# Patient Record
Sex: Female | Born: 1940 | Race: White | Hispanic: No | Marital: Married | Smoking: Never smoker
Health system: Southern US, Community
[De-identification: ages and names within clinical notes are randomized; demographics above are authoritative.]

## PROBLEM LIST (undated history)

## (undated) DIAGNOSIS — I10 Essential (primary) hypertension: Secondary | ICD-10-CM

## (undated) DIAGNOSIS — I739 Peripheral vascular disease, unspecified: Secondary | ICD-10-CM

## (undated) DIAGNOSIS — I2510 Atherosclerotic heart disease of native coronary artery without angina pectoris: Secondary | ICD-10-CM

## (undated) HISTORY — PX: TONSILLECTOMY: SUR1361

## (undated) HISTORY — DX: Peripheral vascular disease, unspecified: I73.9

## (undated) HISTORY — DX: Atherosclerotic heart disease of native coronary artery without angina pectoris: I25.10

---

## 2014-04-16 DIAGNOSIS — I779 Disorder of arteries and arterioles, unspecified: Secondary | ICD-10-CM

## 2014-04-16 DIAGNOSIS — I739 Peripheral vascular disease, unspecified: Secondary | ICD-10-CM | POA: Insufficient documentation

## 2014-04-16 HISTORY — DX: Disorder of arteries and arterioles, unspecified: I77.9

## 2014-04-16 HISTORY — DX: Peripheral vascular disease, unspecified: I73.9

## 2015-03-18 DIAGNOSIS — I10 Essential (primary) hypertension: Secondary | ICD-10-CM

## 2015-03-18 HISTORY — DX: Essential (primary) hypertension: I10

## 2015-04-14 ENCOUNTER — Inpatient Hospital Stay (HOSPITAL_COMMUNITY)
Admission: EM | Admit: 2015-04-14 | Discharge: 2015-04-15 | DRG: 282 | Disposition: A | Discharge status: Home / Self Care | Payer: Medicare Other | Attending: Internal Medicine | Admitting: Internal Medicine

## 2015-04-14 ENCOUNTER — Observation Stay (HOSPITAL_COMMUNITY): Payer: Medicare Other

## 2015-04-14 ENCOUNTER — Other Ambulatory Visit (HOSPITAL_COMMUNITY): Payer: Self-pay

## 2015-04-14 ENCOUNTER — Encounter (HOSPITAL_COMMUNITY): Payer: Self-pay | Admitting: *Deleted

## 2015-04-14 DIAGNOSIS — E876 Hypokalemia: Secondary | ICD-10-CM | POA: Diagnosis present

## 2015-04-14 DIAGNOSIS — Z79899 Other long term (current) drug therapy: Secondary | ICD-10-CM

## 2015-04-14 DIAGNOSIS — I214 Non-ST elevation (NSTEMI) myocardial infarction: Secondary | ICD-10-CM

## 2015-04-14 DIAGNOSIS — R51 Headache: Secondary | ICD-10-CM

## 2015-04-14 DIAGNOSIS — Z7982 Long term (current) use of aspirin: Secondary | ICD-10-CM

## 2015-04-14 DIAGNOSIS — R0989 Other specified symptoms and signs involving the circulatory and respiratory systems: Secondary | ICD-10-CM

## 2015-04-14 DIAGNOSIS — I169 Hypertensive crisis, unspecified: Secondary | ICD-10-CM

## 2015-04-14 DIAGNOSIS — I10 Essential (primary) hypertension: Secondary | ICD-10-CM | POA: Diagnosis not present

## 2015-04-14 DIAGNOSIS — R519 Headache, unspecified: Secondary | ICD-10-CM

## 2015-04-14 DIAGNOSIS — I160 Hypertensive urgency: Secondary | ICD-10-CM

## 2015-04-14 DIAGNOSIS — R55 Syncope and collapse: Secondary | ICD-10-CM | POA: Diagnosis not present

## 2015-04-14 DIAGNOSIS — Z832 Family history of diseases of the blood and blood-forming organs and certain disorders involving the immune mechanism: Secondary | ICD-10-CM

## 2015-04-14 HISTORY — DX: Essential (primary) hypertension: I10

## 2015-04-14 HISTORY — DX: Non-ST elevation (NSTEMI) myocardial infarction: I21.4

## 2015-04-14 HISTORY — DX: Other specified symptoms and signs involving the circulatory and respiratory systems: R09.89

## 2015-04-14 HISTORY — DX: Hypertensive urgency: I16.0

## 2015-04-14 LAB — CBC WITH DIFFERENTIAL/PLATELET
BASOS PCT: 0 % (ref 0–1)
Basophils Absolute: 0 10*3/uL (ref 0.0–0.1)
EOS PCT: 0 % (ref 0–5)
Eosinophils Absolute: 0 10*3/uL (ref 0.0–0.7)
HEMATOCRIT: 37.4 % (ref 36.0–46.0)
HEMOGLOBIN: 12.6 g/dL (ref 12.0–15.0)
Lymphocytes Relative: 17 % (ref 12–46)
Lymphs Abs: 1.4 10*3/uL (ref 0.7–4.0)
MCH: 28.3 pg (ref 26.0–34.0)
MCHC: 33.7 g/dL (ref 30.0–36.0)
MCV: 83.9 fL (ref 78.0–100.0)
MONO ABS: 0.9 10*3/uL (ref 0.1–1.0)
MONOS PCT: 10 % (ref 3–12)
NEUTROS ABS: 6.1 10*3/uL (ref 1.7–7.7)
Neutrophils Relative %: 73 % (ref 43–77)
Platelets: 298 10*3/uL (ref 150–400)
RBC: 4.46 MIL/uL (ref 3.87–5.11)
RDW: 13.2 % (ref 11.5–15.5)
WBC: 8.4 10*3/uL (ref 4.0–10.5)

## 2015-04-14 LAB — I-STAT CHEM 8, ED
BUN: 14 mg/dL (ref 6–23)
CHLORIDE: 96 mmol/L (ref 96–112)
CREATININE: 1 mg/dL (ref 0.50–1.10)
Calcium, Ion: 1.14 mmol/L (ref 1.13–1.30)
GLUCOSE: 118 mg/dL — AB (ref 70–99)
HCT: 39 % (ref 36.0–46.0)
Hemoglobin: 13.3 g/dL (ref 12.0–15.0)
Potassium: 4.1 mmol/L (ref 3.5–5.1)
Sodium: 137 mmol/L (ref 135–145)
TCO2: 27 mmol/L (ref 0–100)

## 2015-04-14 LAB — I-STAT TROPONIN, ED: Troponin i, poc: 0.5 ng/mL (ref 0.00–0.08)

## 2015-04-14 MED ORDER — METOPROLOL TARTRATE 1 MG/ML IV SOLN
5.0000 mg | Freq: Once | INTRAVENOUS | Status: AC
  Administered 2015-04-14: 5 mg via INTRAVENOUS
  Filled 2015-04-14: qty 5

## 2015-04-14 MED ORDER — NITROGLYCERIN 2 % TD OINT
0.5000 [in_us] | TOPICAL_OINTMENT | Freq: Once | TRANSDERMAL | Status: AC
  Administered 2015-04-14: 0.5 [in_us] via TOPICAL
  Filled 2015-04-14: qty 1

## 2015-04-14 MED ORDER — ASPIRIN 81 MG PO CHEW
324.0000 mg | CHEWABLE_TABLET | Freq: Once | ORAL | Status: AC
  Administered 2015-04-14: 324 mg via ORAL
  Filled 2015-04-14: qty 4

## 2015-04-14 MED ORDER — HEPARIN (PORCINE) IN NACL 100-0.45 UNIT/ML-% IJ SOLN
800.0000 [IU]/h | INTRAMUSCULAR | Status: DC
  Administered 2015-04-15: 800 [IU]/h via INTRAVENOUS
  Filled 2015-04-14: qty 250

## 2015-04-14 MED ORDER — HEPARIN BOLUS VIA INFUSION
4000.0000 [IU] | Freq: Once | INTRAVENOUS | Status: AC
  Administered 2015-04-15: 4000 [IU] via INTRAVENOUS
  Filled 2015-04-14: qty 4000

## 2015-04-14 NOTE — ED Notes (Signed)
Pt c/o frontal headache x 3 days; reports intermittent visual issues. Pt reports having a physical two weeks ago and was told blood pressure was elevated. Since PCP visit, pt has changed diet to include more vegetables and fruits with very little meat. No neuro deficits at present.

## 2015-04-14 NOTE — H&P (Addendum)
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  She states that recently when getting a physical for this new job she was told she was markedly hypertensive.  She wanted to attempt diet instead of medications.  Per husband, this means she has not eaten much the last couple of days.  Today she was walking up steps when she had the episode above.  She said this only lasted about 5 minutes.  EM[facility] was called and [facility].  She reports a headache for the last few days associated with some vision changes.  Evaluation here revealed SR with RBBB on ECG and wandering of baseline and normal labs except for troponin of 0.5.     Review of Systems:     Cardiac Review of Systems: {Y] = yes [ ]  = no  Chest Pain [    ]  Resting SOB [   ] Exertional SOB  [  ]  Orthopnea [  ]   Pedal Edema [   ]    Palpitations [  ] Syncope  [  ]   Presyncope [x   ]  General Review of Systems: [Y] = yes [  ]=no Constitional: recent weight change [  ]; anorexia [ x ]; fatigue [  ]; nausea [  ]; night sweats [  ]; fever [  ]; or chills [  ];                                                                      Dental: poor dentition[  ];   Eye : blurred vision [  ]; diplopia [   ]; vision changes [x  ];  Amaurosis fugax[  ]; Resp: cough [  ];  wheezing[  ];  hemoptysis[  ]; shortness of breath[  ]; paroxysmal nocturnal dyspnea[  ]; dyspnea on exertion[  ]; or orthopnea[  ];  GI:  gallstones[  ], vomiting[  ];  dysphagia[  ]; melena[  ];  hematochezia [  ]; heartburn[  ];   GU: kidney stones [  ]; hematuria[  ];   dysuria [  ];  nocturia[  ];               Skin: rash [  ], swelling[  ];, hair loss[  ];  peripheral edema[  ];  or itching[  ]; Musculosketetal: myalgias[  ];  joint swelling[  ];   joint erythema[  ];  joint pain[  ];  back pain[  ];  Heme/Lymph: bruising[  ];  bleeding[  ];  anemia[  ];  Neuro: TIA[  ];  headaches[  ];  stroke[  ];  vertigo[  ];  seizures[  ];   paresthesias[  ];  difficulty walking[  ];  Psych:depression[  ]; anxiety[  ];  Endocrine: diabetes[  ];  thyroid dysfunction[  ];  Other:  Past Medical History  Diagnosis Date  . Hypertension 03/2015    No current facility-administered medications on  file prior to encounter.   No current outpatient prescriptions on file prior to encounter.     Allergies  Allergen Reactions  . Codeine     Makes me crazy    History   Social History  . Marital Status: Married    Spouse Name: N/A  . Number of Children: N/A  . Years of Education: N/A   Occupational History  . Not on file.   Social History Main Topics  . Smoking status: Never Smoker   . Smokeless tobacco: Not on file  . Alcohol Use: No  . Drug Use: No  . Sexual Activity: Not on file   Other Topics Concern  . Not on file   Social History Narrative  . No narrative on file    Family History  Problem Relation Age of Onset  . Protein S deficiency      "family"  . Deep vein thrombosis      "family"    PHYSICAL EXAM: Filed Vitals:   04/14/15 2330  BP: 194/69  Pulse: 76  Temp:   Resp: 12   General:  Well appearing. No respiratory difficulty HEENT: normal Neck: supple. no JVD; bilateral carotid bruits, R>L. No lymphadenopathy or thryomegaly appreciated. Cor: PMI nondisplaced. Regular rate & rhythm. No rubs, gallops or murmurs. Lungs: clear Abdomen: soft, nontender, nondistended. No hepatosplenomegaly. No bruits or masses. Good bowel sounds. Extremities: no cyanosis, clubbing, rash, edema Neuro: alert & oriented x 3, cranial nerves grossly intact. moves all 4 extremities w/o difficulty. Affect pleasant.  ECG: SR with RBBB.  There is baseline wander noted inferior leads.  Results for orders placed or performed during the  hospital encounter of 04/14/15 (from the past 24 hour(s))  CBC with Differential/Platelet     Status: None   Collection Time: 04/14/15  9:55 PM  Result Value Ref Range   WBC 8.4 4.0 - 10.5 K/uL   RBC 4.46 3.87 - 5.11 MIL/uL   Hemoglobin 12.6 12.0 - 15.0 g/dL   HCT 95.6 21.3 - 08.6 %   MCV 83.9 78.0 - 100.0 fL   MCH 28.3 26.0 - 34.0 pg   MCHC 33.7 30.0 - 36.0 g/dL   RDW 57.8 46.9 - 62.9 %   Platelets 298 150 - 400 K/uL   Neutrophils Relative % 73 43 - 77 %   Neutro Abs 6.1 1.7 - 7.7 K/uL   Lymphocytes Relative 17 12 - 46 %   Lymphs Abs 1.4 0.7 - 4.0 K/uL   Monocytes Relative 10 3 - 12 %   Monocytes Absolute 0.9 0.1 - 1.0 K/uL   Eosinophils Relative 0 0 - 5 %   Eosinophils Absolute 0.0 0.0 - 0.7 K/uL   Basophils Relative 0 0 - 1 %   Basophils Absolute 0.0 0.0 - 0.1 K/uL  I-stat chem 8, ed     Status: Abnormal   Collection Time: 04/14/15  9:59 PM  Result Value Ref Range   Sodium 137 135 - 145 mmol/L   Potassium 4.1 3.5 - 5.1 mmol/L   Chloride 96 96 - 112 mmol/L   BUN 14 6 - 23 mg/dL   Creatinine, Ser 5.28 0.50 - 1.10 mg/dL   Glucose, Bld 413 (H) 70 - 99 mg/dL   Calcium, Ion 2.44 0.10 - 1.30 mmol/L   TCO2 27 0 - 100 mmol/L   Hemoglobin 13.3 12.0 - 15.0 g/dL   HCT 27.2 53.6 - 64.4 %  I-stat troponin, ED     Status: Abnormal  Collection Time: 04/14/15 10:00 PM  Result Value Ref Range   Troponin i, poc 0.50 (HH) 0.00 - 0.08 ng/mL   Comment NOTIFIED PHYSICIAN    Comment 3           No results found.   ASSESSMENT: 74 yo woman with hypertension presenting with sudden onset of fatigue, "hot feeling" and "just feeling odd" that lasted about 5 minutes and noted to be markedly hypertensive in ED along with elevated troponin of 0.5.  Given her recent diagnosis of accelerated HTN, headache and visual changes makes hypertensive emergency possible although she is currently asymptomatic.  NSTEMI is also a concern with above presentation.    PLAN/DISCUSSION: ASA, heparin drip NTG  paste on, this should help with BP and can transition to NTG drip if needed Start carvedilol 6.25 mg bid and amlodipine 5 mg tonight Consider starting diuretic such as HCTZ in am Cycle troponin to peak Will not start statin as patient refuses to do so tonight and actually prefers not to take any medication Risk stratify with A1c and lipids Check TSH Echocardiogram in AM I suspect she will likely need a heart catheterization, though not sure she will be readily agreeable and will also need to ensure she will be compliant with medication if that route is chosen.  Carotid dopplers to assess bilateral bruits (R>L)  Emin Foree 04/14/2015 11:51 PM

## 2015-04-14 NOTE — ED Notes (Signed)
Cardiology at bedside.

## 2015-04-14 NOTE — ED Notes (Signed)
[place] after having a near syncopal episode. Reports sudden onset of dizziness around 8pm. Bystanders lowered pt to ground by bystanders. EMS reports orthostatic VS: lying 180/80, sitting 170/82, standing 104 palpated. Pt also c/o intermittent frontal headache x 3 days with visual disturbances described as black spots. Pt recently started new diet, consisting primarily of fruits and vegetables and no meats. Pt noted to be hypertensive on arrival

## 2015-04-14 NOTE — ED Provider Notes (Signed)
CSN: [id number]     Arrival date & time 04/14/15  2111 History   First MD Initiated Contact with Patient 04/14/15 2118     Chief Complaint  Patient presents with  . Near Syncope     (Consider location/radiation/quality/duration/timing/severity/associated sxs/prior Treatment) HPI Comments: The patient is a 74 year old female who presents to the hospital with a frontal headache which has been present for approximately 3 days, this is intermittent, and associated with severe hypertension this evening. This headache has not been associated with fevers chills nausea vomiting diaphoresis blurred vision diarrhea dysuria swelling chest pain palpitations or shortness of breath. She states that this evening while she was training for a new job she was walking lots of stairs and became very lightheaded and had a near syncope event. She denies loss of consciousness and denies any symptoms other than that except for mild nausea. She states that she did get a little bit sweaty. She denies having any prior medical history other than recently being diagnosed with hypertension for which she has changed her diet and has been eating small amounts of food including salads and fruit. Today she had a very small amount of food last ate at 4:00 when she had a piece of toast, piece of fruit.  Patient is a 74 y.o. female presenting with near-syncope. The history is provided by the patient.  Near Syncope    History reviewed. No pertinent past medical history. Past Surgical History  Procedure Laterality Date  . Tonsillectomy     History reviewed. No pertinent family history. History  Substance Use Topics  . Smoking status: Never Smoker   . Smokeless tobacco: Not on file  . Alcohol Use: No   OB History    No data available     Review of Systems  Cardiovascular: Positive for near-syncope.  All other systems reviewed and are negative.     Allergies  Codeine  Home Medications   Prior to Admission  medications   Medication Sig Start Date End Date Taking? Authorizing Provider  Ascorbic Acid (VITAMIN C) 1000 MG tablet Take 1,000 mg by mouth daily.   Yes Historical Provider, MD  Cholecalciferol (VITAMIN D PO) Take 1 tablet by mouth daily.   Yes Historical Provider, MD  MELATONIN PO Take 1 tablet by mouth daily as needed. For sleep per patient   Yes Historical Provider, MD  Multiple Vitamin (MULTIVITAMIN WITH MINERALS) TABS tablet Take 1 tablet by mouth daily.   Yes Historical Provider, MD  Potassium 99 MG TABS Take 1 tablet by mouth daily.   Yes Historical Provider, MD   BP 186/72 mmHg  Pulse 78  Temp(Src) 98.7 F (37.1 C) (Oral)  Resp 12  Ht 5\' 4"  (1.626 m)  Wt 130 lb (58.968 kg)  BMI 22.30 kg/m2  SpO2 96% Physical Exam  Constitutional: She appears well-developed and well-nourished. No distress.  HENT:  Head: Normocephalic and atraumatic.  Mouth/Throat: Oropharynx is clear and moist. No oropharyngeal exudate.  Eyes: Conjunctivae and EOM are normal. Pupils are equal, round, and reactive to light. Right eye exhibits no discharge. Left eye exhibits no discharge. No scleral icterus.  Neck: Normal range of motion. Neck supple. No JVD present. No thyromegaly present.  Cardiovascular: Normal rate, regular rhythm, normal heart sounds and intact distal pulses.  Exam reveals no gallop and no friction rub.   No murmur heard. Pulmonary/Chest: Effort normal and breath sounds normal. No respiratory distress. She has no wheezes. She has no rales.  Abdominal: Soft. Bowel sounds  are normal. She exhibits no distension and no mass. There is no tenderness.  Musculoskeletal: Normal range of motion. She exhibits no edema or tenderness.  Lymphadenopathy:    She has no cervical adenopathy.  Neurological: She is alert. Coordination normal.  Skin: Skin is warm and dry. No rash noted. No erythema.  Psychiatric: She has a normal mood and affect. Her behavior is normal.  Nursing note and vitals  reviewed.   ED Course  Procedures (including critical care time) Labs Review Labs Reviewed  I-STAT CHEM 8, ED - Abnormal; Notable for the following:    Glucose, Bld 118 (*)    All other components within normal limits  I-STAT TROPOININ, ED - Abnormal; Notable for the following:    Troponin i, poc 0.50 (*)    All other components within normal limits  CBC WITH DIFFERENTIAL/PLATELET    Imaging Review No results found.  ED ECG REPORT  I personally interpreted this EKG   Date: 04/14/2015   Rate: 78  Rhythm: normal sinus rhythm  QRS Axis: normal  Intervals: normal  ST/T Wave abnormalities: ST elevations inferiorly  Conduction Disutrbances:right bundle branch block  Narrative Interpretation:   Old EKG Reviewed: none available  ED ECG REPORT  I personally interpreted this EKG   Date: 04/14/2015   Rate: 75  Rhythm: normal sinus rhythm  QRS Axis: normal  Intervals: normal  ST/T Wave abnormalities: nonspecific T wave changes  Conduction Disutrbances:none  Narrative Interpretation:   Old EKG Reviewed: changes noted   MDM   Final diagnoses:  Headache  NSTEMI (non-ST elevated myocardial infarction)  Hypertensive crisis    The initial EKG was very concerning for ischemia with elevation in the inferior leads though the baseline was not true, repeat EKG did not show the same ischemic findings, the patient had no chest pain and no ongoing symptoms on arrival. Because of the abnormal EKG a cardiac workup was initiated and a troponin came back elevated at 0.5. Her blood pressure maintained a significant elevation, she was given a dose of Lopressor as well as started on nitroglycerin and heparin drip, care was discussed with the on-call cardiologist who agrees to admit the patient to a stepdown unit for a non-ST elevation MI. The patient is critically ill and will need admission to the hospital, heparin drip, nitroglycerin.  CRITICAL CARE Performed by: Vida Roller Total critical  care time: 35 Critical care time was exclusive of separately billable procedures and treating other patients. Critical care was necessary to treat or prevent imminent or life-threatening deterioration. Critical care was time spent personally by me on the following activities: development of treatment plan with patient and/or surrogate as well as nursing, discussions with consultants, evaluation of patient's response to treatment, examination of patient, obtaining history from patient or surrogate, ordering and performing treatments and interventions, ordering and review of laboratory studies, ordering and review of radiographic studies, pulse oximetry and re-evaluation of patient's condition.   Meds given in ED:  Medications  heparin bolus via infusion 4,000 Units (not administered)  heparin ADULT infusion 100 units/mL (25000 units/250 mL) (not administered)  nitroGLYCERIN (NITROGLYN) 2 % ointment 0.5 inch (not administered)  metoprolol (LOPRESSOR) injection 5 mg (5 mg Intravenous Given 04/14/15 2244)  aspirin chewable tablet 324 mg (324 mg Oral Given 04/14/15 2243)         Eber Hong, MD 04/14/15 2245

## 2015-04-15 DIAGNOSIS — R0989 Other specified symptoms and signs involving the circulatory and respiratory systems: Secondary | ICD-10-CM

## 2015-04-15 DIAGNOSIS — I214 Non-ST elevation (NSTEMI) myocardial infarction: Secondary | ICD-10-CM | POA: Diagnosis present

## 2015-04-15 DIAGNOSIS — Z832 Family history of diseases of the blood and blood-forming organs and certain disorders involving the immune mechanism: Secondary | ICD-10-CM | POA: Diagnosis not present

## 2015-04-15 DIAGNOSIS — I10 Essential (primary) hypertension: Secondary | ICD-10-CM

## 2015-04-15 DIAGNOSIS — Z7982 Long term (current) use of aspirin: Secondary | ICD-10-CM | POA: Diagnosis not present

## 2015-04-15 DIAGNOSIS — E785 Hyperlipidemia, unspecified: Secondary | ICD-10-CM | POA: Diagnosis not present

## 2015-04-15 DIAGNOSIS — Z79899 Other long term (current) drug therapy: Secondary | ICD-10-CM | POA: Diagnosis not present

## 2015-04-15 DIAGNOSIS — R55 Syncope and collapse: Secondary | ICD-10-CM | POA: Diagnosis present

## 2015-04-15 DIAGNOSIS — I249 Acute ischemic heart disease, unspecified: Secondary | ICD-10-CM

## 2015-04-15 DIAGNOSIS — I213 ST elevation (STEMI) myocardial infarction of unspecified site: Secondary | ICD-10-CM | POA: Diagnosis not present

## 2015-04-15 DIAGNOSIS — Z951 Presence of aortocoronary bypass graft: Secondary | ICD-10-CM | POA: Diagnosis not present

## 2015-04-15 DIAGNOSIS — E876 Hypokalemia: Secondary | ICD-10-CM | POA: Diagnosis present

## 2015-04-15 LAB — TROPONIN I
TROPONIN I: 0.82 ng/mL — AB (ref <0.031)
TROPONIN I: 0.77 ng/mL — AB (ref <0.031)

## 2015-04-15 LAB — BASIC METABOLIC PANEL
Anion gap: 12 (ref 5–15)
BUN: 9 mg/dL (ref 6–23)
CO2: 27 mmol/L (ref 19–32)
Calcium: 9 mg/dL (ref 8.4–10.5)
Chloride: 98 mmol/L (ref 96–112)
Creatinine, Ser: 0.83 mg/dL (ref 0.50–1.10)
GFR calc Af Amer: 80 mL/min — ABNORMAL LOW (ref >90)
GFR calc non Af Amer: 69 mL/min — ABNORMAL LOW (ref >90)
Glucose, Bld: 86 mg/dL (ref 70–99)
POTASSIUM: 3.1 mmol/L — AB (ref 3.5–5.1)
SODIUM: 137 mmol/L (ref 135–145)

## 2015-04-15 LAB — CBC
HCT: 34.2 % — ABNORMAL LOW (ref 36.0–46.0)
HEMATOCRIT: 34.4 % — AB (ref 36.0–46.0)
HEMOGLOBIN: 11.3 g/dL — AB (ref 12.0–15.0)
HEMOGLOBIN: 11.3 g/dL — AB (ref 12.0–15.0)
MCH: 27.5 pg (ref 26.0–34.0)
MCH: 27.4 pg (ref 26.0–34.0)
MCHC: 33 g/dL (ref 30.0–36.0)
MCHC: 32.8 g/dL (ref 30.0–36.0)
MCV: 83.2 fL (ref 78.0–100.0)
MCV: 83.5 fL (ref 78.0–100.0)
Platelets: 298 10*3/uL (ref 150–400)
Platelets: 285 10*3/uL (ref 150–400)
RBC: 4.11 MIL/uL (ref 3.87–5.11)
RBC: 4.12 MIL/uL (ref 3.87–5.11)
RDW: 13.1 % (ref 11.5–15.5)
RDW: 13.2 % (ref 11.5–15.5)
WBC: 7 10*3/uL (ref 4.0–10.5)
WBC: 6.7 10*3/uL (ref 4.0–10.5)

## 2015-04-15 LAB — LIPID PANEL
Cholesterol: 230 mg/dL — ABNORMAL HIGH (ref 0–200)
HDL: 41 mg/dL (ref >39)
LDL CALC: 172 mg/dL — AB (ref 0–99)
Total CHOL/HDL Ratio: 5.6 RATIO
Triglycerides: 87 mg/dL (ref <150)
VLDL: 17 mg/dL (ref 0–40)

## 2015-04-15 LAB — BRAIN NATRIURETIC PEPTIDE: B Natriuretic Peptide: 181.5 pg/mL — ABNORMAL HIGH (ref 0.0–100.0)

## 2015-04-15 LAB — MRSA PCR SCREENING: MRSA by PCR: NEGATIVE

## 2015-04-15 LAB — HEPARIN LEVEL (UNFRACTIONATED): Heparin Unfractionated: 0.47 IU/mL (ref 0.30–0.70)

## 2015-04-15 LAB — TSH: TSH: 2.257 u[IU]/mL (ref 0.350–4.500)

## 2015-04-15 MED ORDER — ACETAMINOPHEN 325 MG PO TABS: 650.0000 mg | ORAL_TABLET | ORAL | Status: DC | PRN

## 2015-04-15 MED ORDER — CARVEDILOL 6.25 MG PO TABS
6.2500 mg | ORAL_TABLET | Freq: Two times a day (BID) | ORAL | Status: DC
  Administered 2015-04-15: 6.25 mg via ORAL
  Filled 2015-04-15: qty 1

## 2015-04-15 MED ORDER — NITROGLYCERIN 0.4 MG SL SUBL: 0.4000 mg | SUBLINGUAL_TABLET | SUBLINGUAL | Status: DC | PRN

## 2015-04-15 MED ORDER — ONDANSETRON HCL 4 MG/2ML IJ SOLN: 4.0000 mg | Freq: Four times a day (QID) | INTRAMUSCULAR | Status: DC | PRN

## 2015-04-15 MED ORDER — AMLODIPINE BESYLATE 5 MG PO TABS: 5.0000 mg | ORAL_TABLET | Freq: Every day | ORAL | Status: DC

## 2015-04-15 MED ORDER — ASPIRIN EC 81 MG PO TBEC
81.0000 mg | DELAYED_RELEASE_TABLET | Freq: Every day | ORAL | Status: DC
  Administered 2015-04-15: 81 mg via ORAL
  Filled 2015-04-15 (×2): qty 1

## 2015-04-15 MED ORDER — LISINOPRIL 10 MG PO TABS: 10.0000 mg | ORAL_TABLET | Freq: Every day | ORAL | Status: DC

## 2015-04-15 MED ORDER — AMLODIPINE BESYLATE 5 MG PO TABS
5.0000 mg | ORAL_TABLET | Freq: Every day | ORAL | Status: DC
  Administered 2015-04-15: 5 mg via ORAL
  Filled 2015-04-15: qty 1

## 2015-04-15 MED ORDER — ASPIRIN 81 MG PO TBEC: 81.0000 mg | DELAYED_RELEASE_TABLET | Freq: Every day | ORAL | Status: DC

## 2015-04-15 MED ORDER — CARVEDILOL 6.25 MG PO TABS: 6.2500 mg | ORAL_TABLET | Freq: Two times a day (BID) | ORAL | Status: AC

## 2015-04-15 NOTE — ED Notes (Signed)
Charge Nurse informed appt time@0350 .

## 2015-04-15 NOTE — Progress Notes (Signed)
ANTICOAGULATION CONSULT NOTE - Initial Consult  Pharmacy Consult for heparin Indication: chest pain/ACS  Allergies  Allergen Reactions  . Codeine     Makes me crazy    Patient Measurements: Height: 5\' 4"  (162.6 cm) Weight: 130 lb (58.968 kg) IBW/kg (Calculated) : 54.7  Vital Signs: Temp: 98.7 F (37.1 C) (04/28 2240) Temp Source: Oral (04/28 2122) BP: 148/54 mmHg (04/29 0230) Pulse Rate: 70 (04/29 0230)  Labs:  Recent Labs  04/14/15 2155 04/14/15 2159  HGB 12.6 13.3  HCT 37.4 39.0  PLT 298  --   CREATININE  --  1.00    Estimated Creatinine Clearance: 43.9 mL/min (by C-G formula based on Cr of 1).   Medical History: Past Medical History  Diagnosis Date  . Hypertension 03/2015     Assessment: 73yo female c/o sudden onset of fatigue and feeling "hot and odd", initial istat troponin elevated, to begin heparin; of note pt initially refused heparin and would prefer "natural" medications, heparin has since been started.  Goal of Therapy:  Heparin level 0.3-0.7 units/ml Monitor platelets by anticoagulation protocol: Yes   Plan:  EDP started heparin with 4000 units IV bolus followed by gtt at 800 units/hr; will continue for now and monitor heparin levels and CBC.  Vernard GamblesVeronda Ladora Osterberg, PharmD, BCPS  04/15/2015,2:48 AM

## 2015-04-15 NOTE — Progress Notes (Signed)
Echocardiogram 2D Echocardiogram has been performed.  Delfin Squillace 04/15/2015, 10:44 AM

## 2015-04-15 NOTE — Care Management Note (Unsigned)
    Page 1 of 1   04/15/2015     12:58:53 PM CARE MANAGEMENT NOTE 04/15/2015  Patient:  Alexis Moss,Alexis Moss   Account Number:  [id number]  Date Initiated:  04/15/2015  Documentation initiated by:  GRAVES-BIGELOW,Nemesis [place]  Subjective/Objective Assessment:   Pt admitted for co. Positive cardiac Enzymes. In need of cath. Pt with financial concerns due to having Medicare Part A only.     Action/Plan:   CM did speak with pt in regards to concerns. [facility] and she will speak with pt about concerns. No further needs from CM at this time.   Anticipated DC Date:  04/18/2015   Anticipated DC Plan:  HOME/SELF CARE  In-house referral  Financial Counselor      DC Planning Services  CM consult      Choice offered to / List presented to:             Status of service:  In process, will continue to follow Medicare Important Message given?   (If response is "NO", the following Medicare IM given date fields will be blank) Date Medicare IM given:   Medicare IM given by:   Date Additional Medicare IM given:   Additional Medicare IM given by:    Discharge Disposition:    Per UR Regulation:  Reviewed for med. necessity/level of care/duration of stay  If discussed at Long Length of Stay Meetings, dates discussed:    Comments:

## 2015-04-15 NOTE — Discharge Summary (Signed)
Physician Discharge Summary     Cardiologist:  Skains-new Patient ID:   Alexis Moss MRN: [id number] DOB/AGE: January 04, 1941 74 y.o.  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 She states that recently, when getting a physical for this new job, she was told she was markedly hypertensive. She wanted to attempt diet instead of medications. Per husband, this means she has not eaten much the last couple of days. Today she was walking up steps when she had the episode above. She said this only lasted about 5 minutes. [facility]. She reported a headache for the last few days associated with some vision changes. Evaluation here revealed SR with RBBB on ECG and wandering of baseline and normal labs except for troponin of 0.5.   She was admitted and started on heparin drip, NTG paste, coreg 6.25bid, amlodipine 5.  Troponin peaked at 0.82.  LDL 172 but patient refuses statin.  TSH WNL.  CT head negative.  Echo revealed EF of 65-70%, moderate LVH, G1DD(see full report below).  Dr. Anne FuSkains review the echo- mitral annular calcification with mild mitral regurgitation, mitral valve thickening noted noted. No evidence of fever, no white count.  She does have a right greater than left carotid bruit. He  recommended checking carotid ultrasound as outpatient.  He also recommended to perform cardiac catheterization to define her anatomy in the setting of what  could be supply demand mismatch. However, she was quite hesitant about proceeding in this direction. It would not be unreasonable for her to have an outpatient nuclear stress test for further risk stratification given the low level troponin elevation.  Dr. Anne FuSkains did relate to her that any level of increase in troponin, albeit low level, still portends an increase cardiac risk/morbidity mortality.  She admits that she has not seen a doctor in several years and was encouraged to develop a relationship with a primary care physician [place].  She was quite concerned about financial aspects of this hospitalization. Financial counselor has discussed her case with her. She also expressed that to her nurse.  The patient was seen by Dr. Anne FuSkains who felt she was stable for DC home.    Consults:  None  Significant Diagnostic Studies:   CT HEAD WITHOUT CONTRAST  TECHNIQUE: Contiguous axial images were obtained from the base of the skull through the vertex without intravenous contrast.  COMPARISON: None.  FINDINGS: Negative for acute intracranial hemorrhage, acute infarction, mass, mass effect, hydrocephalus or midline shift. Gray-white differentiation is preserved throughout. No focal soft tissue or calvarial abnormality. Globes and orbits are symmetric bilaterally. Normal aeration of the mastoid air cells and visualized paranasal sinuses. Atherosclerotic calcification present in both cavernous carotid arteries.  IMPRESSION: 1. No acute intracranial abnormality. 2. Intracranial atherosclerosis.  TTE Study Conclusions  - Left ventricle: The cavity size was normal. Wall thickness was increased in a pattern of moderate LVH. Systolic function was vigorous. The estimated ejection  fraction was in the range of 65% to 70%. Doppler parameters are consistent with abnormal left ventricular relaxation (grade 1 diastolic dysfunction). - Aortic valve: There  was mild regurgitation. - Mitral valve: MV is thickened with signifiant annular calcification. In the apical 4 chamber view there is increased shadowing of valve into the LA that may reflect reverberation artifact vs prolapse. Cannot completely exclude mass, though less likely since motion is uniform from beat to beat. Clinical correlation indicated. No old echoes to compare. Calcified annulus. Mildly thickened leaflets . There was mild regurgitation. - Right ventricle: The cavity size was mildly dilated. - Pulmonary arteries: PA peak pressure: 31 mm Hg (S).  Treatments: See above  Discharge Exam: Blood pressure 173/78, pulse 60, temperature 98.5 F (36.9 C), temperature source Oral, resp. rate 10, height  (1.626 m), weight 129 lb 6.4 oz (58.695 kg), SpO2 95 %.   Disposition: Final discharge disposition not confirmed      Discharge Instructions    Diet - low sodium heart healthy    Complete by:  As directed      Discharge instructions    Complete by:  As directed   1.  If you have chest pain, take ONE tablet and wait five minutes. 2.  If you continue to have chest pain, take a SECOND tablet and wait five minutes. 3.  If you continue to have chest pain, take a THIRD table and call 911!     Increase activity slowly    Complete by:  As directed             Medication List    TAKE these medications        amLODipine 5 MG tablet  Commonly known as:  NORVASC  Take 1 tablet (5 mg total) by mouth daily.     aspirin 81 MG EC tablet  Take 1 tablet (81 mg total) by mouth daily.     carvedilol 6.25 MG tablet  Commonly known as:  COREG  Take 1 tablet (6.25 mg total) by mouth 2 (two) times daily with a meal.     lisinopril 10 MG tablet  Commonly known as:  PRINIVIL,ZESTRIL  Take 1 tablet (10 mg total) by mouth daily.     MELATONIN PO  Take 1 tablet by mouth daily as needed. For sleep per patient     multivitamin with minerals Tabs tablet  Take 1 tablet  by mouth daily.     nitroGLYCERIN 0.4 MG SL tablet  Commonly known as:  NITROSTAT  Place 1 tablet (0.4 mg total) under the tongue every 5 (five) minutes x 3 doses as needed for chest pain.     Potassium 99 MG Tabs  Take 1 tablet by mouth daily.     vitamin C 1000 MG tablet  Take 1,000 mg by mouth daily.     VITAMIN D PO  Take 1 tablet by mouth daily.       Follow-up Information    Follow up with Donato Schultz, MD On 05/02/2015.   Specialty:  Cardiology   Why:  3:45 PM.   Contact information:   [address] [place] [address] [phone]      Greater than 30 minutes was spent completing the patient's discharge.    SignedWilburt Finlay, PAC 04/15/2015, 3:33 PM

## 2015-04-15 NOTE — Progress Notes (Signed)
Notified Bryan Hagar of troponin of 0.82. Pt denies chest pain. Will cont to monitor

## 2015-04-15 NOTE — Progress Notes (Signed)
ANTICOAGULATION CONSULT NOTE Pharmacy Consult for heparin Indication: chest pain/ACS  Allergies  Allergen Reactions  . Codeine     Makes me crazy    Patient Measurements: Height: 5\' 4"  (162.6 cm) Weight: 129 lb 6.4 oz (58.695 kg) IBW/kg (Calculated) : 54.7  Vital Signs: Temp: 97.9 F (36.6 C) (04/29 0742) Temp Source: Oral (04/29 0742) BP: 143/53 mmHg (04/29 0745) Pulse Rate: 65 (04/29 0745)  Labs:  Recent Labs  04/14/15 2155 04/14/15 2159 04/15/15 0615 04/15/15 0820  HGB 12.6 13.3 11.3* 11.3*  HCT 37.4 39.0 34.2* 34.4*  PLT 298  --  298 285  HEPARINUNFRC  --   --   --  0.47  CREATININE  --  1.00 0.83  --   TROPONINI  --   --  0.82*  --     Estimated Creatinine Clearance: 52.9 mL/min (by C-G formula based on Cr of 0.83).   Medical History: Past Medical History  Diagnosis Date  . Hypertension 03/2015     Assessment: 73yo female c/o sudden onset of fatigue and feeling "hot and odd", initial istat troponin elevated, to begin heparin; of note pt initially refused heparin and would prefer "natural" medications, heparin has since been started. First HL is therapeutic at 0.47 after a 4000 unit bolus and drip at 800 units/hr. + troponin x 2.  CBC stable, no bleeding reported.   Goal of Therapy:  Heparin level 0.3-0.7 units/ml Monitor platelets by anticoagulation protocol: Yes   Plan:  continue heparin drip at 800 units/hr Recheck HL for confirmation with next cardiac enzyme scheduled at 1600 Daily HL and CBC while on heparin  Herby AbrahamMichelle T. Cherith Tewell, Pharm.D. [phone] 04/15/2015 10:47 AM

## 2015-04-15 NOTE — Progress Notes (Signed)
UR completed 

## 2015-04-15 NOTE — Progress Notes (Addendum)
Subjective:   Currently not having any chest discomfort. Occasionally will feel headache. She has not decided about procedure.  Objective:  Vital Signs in the last 24 hours: Temp:  [97.9 F (36.6 C)-98.7 F (37.1 C)] 98.5 F (36.9 C) (04/29 1100) Pulse Rate:  [60-83] 60 (04/29 1100) Resp:  [10-19] 10 (04/29 1100) BP: (143-202)/(53-79) 173/78 mmHg (04/29 1100) SpO2:  [91 %-100 %] 95 % (04/29 1100) Weight:  [129 lb 6.4 oz (58.695 kg)-130 lb (58.968 kg)] 129 lb 6.4 oz (58.695 kg) (04/29 0401)  Intake/Output from previous day: 04/28 0701 - 04/29 0700 In: 43.2 [I.V.:43.2] Out: -    Physical Exam: General: Well developed, well nourished, in no acute distress. Head:  Normocephalic and atraumatic. Lungs: Clear to auscultation and percussion. Heart: Normal S1 and S2.  No murmur, rubs or gallops.  Abdomen: soft, non-tender, positive bowel sounds. Extremities: No clubbing or cyanosis. No edema. Neurologic: Alert and oriented x 3. Mildly anxious.    Lab Results:  Recent Labs  04/15/15 0615 04/15/15 0820  WBC 7.0 6.7  HGB 11.3* 11.3*  PLT 298 285    Recent Labs  04/14/15 2159 04/15/15 0615  NA 137 137  K 4.1 3.1*  CL 96 98  CO2  --  27  GLUCOSE 118* 86  BUN 14 9  CREATININE 1.00 0.83    Recent Labs  04/15/15 0615 04/15/15 0950  TROPONINI 0.82* 0.77*   Hepatic Function Panel No results for input(s): PROT, ALBUMIN, AST, ALT, ALKPHOS, BILITOT, BILIDIR, IBILI in the last 72 hours.  Recent Labs  04/15/15 0615  CHOL 230*   No results for input(s): PROTIME in the last 72 hours.  Imaging: Ct Head Wo Contrast  04/15/2015   CLINICAL DATA:  74 year old female with 3 day history of frontal headache and near syncope  EXAM: CT HEAD WITHOUT CONTRAST  TECHNIQUE: Contiguous axial images were obtained from the base of the skull through the vertex without intravenous contrast.  COMPARISON:  None.  FINDINGS: Negative for acute intracranial hemorrhage, acute infarction,  mass, mass effect, hydrocephalus or midline shift. Gray-white differentiation is preserved throughout. No focal soft tissue or calvarial abnormality. Globes and orbits are symmetric bilaterally. Normal aeration of the mastoid air cells and visualized paranasal sinuses. Atherosclerotic calcification present in both cavernous carotid arteries.  IMPRESSION: 1. No acute intracranial abnormality. 2. Intracranial atherosclerosis.   Electronically Signed   By: Malachy MoanHeath  McCullough M.D.   On: 04/15/2015 00:35   Personally viewed.   Telemetry: No adverse arrhythmias Personally viewed.   EKG:  Right bundle branch block originally with nonspecific ST-T wave changes. Currently narrow complex QRS.  Cardiac Studies:  Echocardiogram pending.  Assessment/Plan:  Active Problems:   NSTEMI (non-ST elevated myocardial infarction)   Hypertension   Hypertensive urgency   Bilateral carotid bruits  Non-ST elevation myocardial infarction  - Her troponin was mildly elevated at 0.5 increased 0.8 and is now decreasing to 0.7. Certainly hypertension/supply demand mismatch may be playing a role in this. Nondescript feelings, vagal-like "hot feeling "just feeling odd prior to admission.  - Her brother has had carotid artery disease as well as coronary artery disease and given her elevation in troponin albeit mild even in the setting of transient hypertension my recommendation was to perform diagnostic angiogram to exclude underlying flow limiting coronary artery disease. At this time, she is pondering the options. She does understand that mildly increased troponin nonetheless does increase cardiovascular mortality/morbidity. Other options include nuclear stress test in the future  to further risk stratify/exclude ischemia. At this time, my recommendation would be to proceed with angiogram.  I have reviewed admitting physician's note from last night. Statin was not started as patient refuses to do so 10 night and actually prefers  not to take any medication. He suspected that she would likely need a cardiac catheterization though he was not sure that she would be readily agreeable.  If she does not desire to proceed with angiogram, second option would be to perform outpatient nuclear stress test and continue to optimize blood pressure control.  Carotid Dopplers were ordered to assess bruits right greater than left.  She also noted that she has a family history of protein S deficiency.  Lengthy discussion with she and her husband answering several questions and describing her condition, prognosis, possible procedures.  Donato Schultz, MD    Anne Fu, Bruce Mayers 04/15/2015, 12:09 PM

## 2015-04-15 NOTE — ED Notes (Addendum)
Pt initially refusing heparin medication with Dr.Alvarez at bedside,stating "it's rat poision!". Pt reports relying of natural medications if any are needed. After talking with patient, pt willing to accept heparin.

## 2015-04-15 NOTE — Progress Notes (Signed)
I reviewed d/c instructions with patient and her husband. No further questions. Pt d/c'd to private vehicle via wheelchair

## 2015-04-15 NOTE — Progress Notes (Signed)
    I personally reviewed echocardiogram, preliminary read demonstrates normal ejection fraction, mitral annular calcification with mild mitral regurgitation, mitral valve thickening noted noted. No evidence of fever, no white count.  Her third troponin has decreased 0.7 from 0.8 with original troponin of 0.5.  She does have a right greater than left carotid bruit. I recommended checking carotid ultrasound as outpatient.  My recommendation initially was to perform cardiac catheterization to define her anatomy in the setting of what could be supply demand mismatch. However she was quite hesitant overnight and this morning proceeding in this direction. It would not be unreasonable for her to have an outpatient nuclear stress test for further risk stratification given the low level troponin elevation. I did relate to her that any level of increase in troponin albeit low level, still portends an increase cardiac risk/morbidity mortality.  #1 goal at this point is to control hypertension. She admits that she has not seen a doctor in several years. I have encouraged that she develop a relationship with a primary care physician around the [place] area where she lives. In the meantime, I will see her back in clinic until this is established.  She was quite concerned about financial aspects of this hospitalization. Financial counselor has discussed her case with her. She also expressed to her nurse that  Discharge on her Coreg 6.25 mg twice a day, amlodipine 5 g once a day and add lisinopril 10 mg once a day. Also use aspirin 81 mg a day.  In clinic follow-up, check basic metabolic profile. May ultimately need potassium supplementation.  TSH normal.  LDL 172 but patient refuses statin.  We will see her back in follow-up in 1-2 weeks.  Donato SchultzSKAINS, Camilo Mander, MD

## 2015-04-15 NOTE — Discharge Instructions (Signed)
Angina Pectoris  Angina pectoris, often just called angina, is extreme discomfort in your chest, neck, or arm caused by a lack of blood in the middle and thickest layer of your heart wall (myocardium). It may feel like tightness or heavy pressure. It may feel like a crushing or squeezing pain. Some people say it feels like gas or indigestion. It may go down your shoulders, back, and arms. Some people may have symptoms other than pain. These symptoms include fatigue, shortness of breath, cold sweats, or nausea. There are four different types of angina:  · Stable angina--Stable angina usually occurs in episodes of predictable frequency and duration. It usually is brought on by physical activity, emotional stress, or excitement. These are all times when the myocardium needs more oxygen. Stable angina usually lasts a few minutes and often is relieved by taking a medicine that can be taken under your tongue (sublingually). The medicine is called nitroglycerin. Stable angina is caused by a buildup of plaque inside the arteries, which restricts blood flow to the heart muscle (atherosclerosis).  · Unstable angina--Unstable angina can occur even when your body experiences little or no physical exertion. It can occur during sleep. It can also occur at rest. It can suddenly increase in severity or frequency. It might not be relieved by sublingual nitroglycerin. It can last up to 30 minutes. The most common cause of unstable angina is a blood clot that has developed on the top of plaque buildup inside a coronary artery. It can lead to a heart attack if the blood clot completely blocks the artery.  · Microvascular angina--This type of angina is caused by a disorder of tiny blood vessels called arterioles. Microvascular angina is more common in women. The pain may be more severe and last longer than other types of angina pectoris.  · Prinzmetal or variant angina--This type of angina pectoris usually occurs when your body  experiences little or no physical exertion. It especially occurs in the early morning hours. It is caused by a spasm of your coronary artery.  HOME CARE INSTRUCTIONS   · Only take over-the-counter and prescription medicines as directed by your health care provider.  · Stay active or increase your exercise as directed by your health care provider.  · Limit strenuous activity as directed by your health care provider.  · Limit heavy lifting as directed by your health care provider.  · Maintain a healthy weight.  · Learn about and eat heart-healthy foods.  · Do not use any tobacco products including cigarettes, chewing tobacco or electronic cigarettes.  SEEK IMMEDIATE MEDICAL CARE IF:   You experience the following symptoms:  · Chest, neck, deep shoulder, or arm pain or discomfort that lasts more than a few minutes.  · Chest, neck, deep shoulder, or arm pain or discomfort that goes away and comes back, repeatedly.  · Heavy sweating with discomfort, without a noticeable cause.  · Shortness of breath or difficulty breathing.  · Angina that does not get better after a few minutes of rest or after taking sublingual nitroglycerin.  These can all be symptoms of a heart attack, which is a medical emergency! Get medical help at once. Call your local emergency service (911 in [place] immediately. Do not  drive yourself to the hospital and do not  wait to for your symptoms to go away.  MAKE SURE YOU:  · Understand these instructions.  · Will watch your condition.  · Will get help right away if you are not   doing well or get worse.  Document Released: 12/03/2005 Document Revised: 12/08/2013 Document Reviewed: 04/06/2014  [employer]® Patient Information ©2015 [employer]. This information is not intended to replace advice given to you by your health care provider. Make sure you discuss any questions you have with your health care provider.

## 2015-04-16 LAB — HEMOGLOBIN A1C
Hgb A1c MFr Bld: 5.9 % — ABNORMAL HIGH (ref 4.8–5.6)
MEAN PLASMA GLUCOSE: 123 mg/dL

## 2015-04-18 ENCOUNTER — Encounter (HOSPITAL_COMMUNITY): Payer: Self-pay | Admitting: *Deleted

## 2015-04-18 ENCOUNTER — Emergency Department (HOSPITAL_COMMUNITY): Payer: Medicare Other

## 2015-04-18 ENCOUNTER — Inpatient Hospital Stay (HOSPITAL_COMMUNITY)
Admission: EM | Admit: 2015-04-18 | Discharge: 2015-04-26 | DRG: 235 | Disposition: A | Discharge status: Skilled Nursing Facility | Payer: Medicare Other | Attending: Thoracic Surgery (Cardiothoracic Vascular Surgery) | Admitting: Thoracic Surgery (Cardiothoracic Vascular Surgery)

## 2015-04-18 DIAGNOSIS — I249 Acute ischemic heart disease, unspecified: Secondary | ICD-10-CM

## 2015-04-18 DIAGNOSIS — R0989 Other specified symptoms and signs involving the circulatory and respiratory systems: Secondary | ICD-10-CM | POA: Diagnosis not present

## 2015-04-18 DIAGNOSIS — Z832 Family history of diseases of the blood and blood-forming organs and certain disorders involving the immune mechanism: Secondary | ICD-10-CM

## 2015-04-18 DIAGNOSIS — I4510 Unspecified right bundle-branch block: Secondary | ICD-10-CM | POA: Diagnosis present

## 2015-04-18 DIAGNOSIS — I160 Hypertensive urgency: Secondary | ICD-10-CM | POA: Diagnosis present

## 2015-04-18 DIAGNOSIS — R079 Chest pain, unspecified: Secondary | ICD-10-CM | POA: Diagnosis not present

## 2015-04-18 DIAGNOSIS — I10 Essential (primary) hypertension: Secondary | ICD-10-CM | POA: Diagnosis present

## 2015-04-18 DIAGNOSIS — Z7982 Long term (current) use of aspirin: Secondary | ICD-10-CM

## 2015-04-18 DIAGNOSIS — Z951 Presence of aortocoronary bypass graft: Secondary | ICD-10-CM

## 2015-04-18 DIAGNOSIS — I25110 Atherosclerotic heart disease of native coronary artery with unstable angina pectoris: Principal | ICD-10-CM | POA: Diagnosis present

## 2015-04-18 DIAGNOSIS — R110 Nausea: Secondary | ICD-10-CM | POA: Diagnosis not present

## 2015-04-18 DIAGNOSIS — I214 Non-ST elevation (NSTEMI) myocardial infarction: Secondary | ICD-10-CM | POA: Diagnosis present

## 2015-04-18 DIAGNOSIS — E8770 Fluid overload, unspecified: Secondary | ICD-10-CM | POA: Diagnosis not present

## 2015-04-18 DIAGNOSIS — E785 Hyperlipidemia, unspecified: Secondary | ICD-10-CM | POA: Diagnosis present

## 2015-04-18 DIAGNOSIS — D62 Acute posthemorrhagic anemia: Secondary | ICD-10-CM | POA: Diagnosis not present

## 2015-04-18 DIAGNOSIS — E876 Hypokalemia: Secondary | ICD-10-CM | POA: Diagnosis not present

## 2015-04-18 DIAGNOSIS — Z79899 Other long term (current) drug therapy: Secondary | ICD-10-CM

## 2015-04-18 DIAGNOSIS — I2510 Atherosclerotic heart disease of native coronary artery without angina pectoris: Secondary | ICD-10-CM

## 2015-04-18 LAB — CBC
HCT: 38.1 % (ref 36.0–46.0)
Hemoglobin: 12.3 g/dL (ref 12.0–15.0)
MCH: 27.9 pg (ref 26.0–34.0)
MCHC: 32.3 g/dL (ref 30.0–36.0)
MCV: 86.4 fL (ref 78.0–100.0)
Platelets: 315 10*3/uL (ref 150–400)
RBC: 4.41 MIL/uL (ref 3.87–5.11)
RDW: 13.2 % (ref 11.5–15.5)
WBC: 6.1 10*3/uL (ref 4.0–10.5)

## 2015-04-18 LAB — COMPREHENSIVE METABOLIC PANEL
ALBUMIN: 3.7 g/dL (ref 3.5–5.0)
ALK PHOS: 78 U/L (ref 38–126)
ALT: 12 U/L — AB (ref 14–54)
ANION GAP: 10 (ref 5–15)
AST: 17 U/L (ref 15–41)
BUN: 11 mg/dL (ref 6–20)
CO2: 28 mmol/L (ref 22–32)
Calcium: 9.6 mg/dL (ref 8.9–10.3)
Chloride: 102 mmol/L (ref 101–111)
Creatinine, Ser: 0.76 mg/dL (ref 0.44–1.00)
GFR calc Af Amer: >60 mL/min (ref >60)
Glucose, Bld: 88 mg/dL (ref 70–99)
POTASSIUM: 4.6 mmol/L (ref 3.5–5.1)
Sodium: 140 mmol/L (ref 135–145)
TOTAL PROTEIN: 7.4 g/dL (ref 6.5–8.1)
Total Bilirubin: 0.5 mg/dL (ref 0.3–1.2)

## 2015-04-18 LAB — I-STAT TROPONIN, ED: TROPONIN I, POC: 0.11 ng/mL — AB (ref 0.00–0.08)

## 2015-04-18 LAB — TROPONIN I: Troponin I: 0.11 ng/mL — ABNORMAL HIGH (ref <0.031)

## 2015-04-18 MED ORDER — NITROGLYCERIN IN D5W 200-5 MCG/ML-% IV SOLN
0.0000 ug/min | Freq: Once | INTRAVENOUS | Status: AC
  Administered 2015-04-18: 16.667 ug/min via INTRAVENOUS
  Filled 2015-04-18: qty 250

## 2015-04-18 MED ORDER — ASPIRIN EC 81 MG PO TBEC
81.0000 mg | DELAYED_RELEASE_TABLET | Freq: Every day | ORAL | Status: DC
  Administered 2015-04-19: 81 mg via ORAL
  Filled 2015-04-18: qty 1

## 2015-04-18 MED ORDER — HEPARIN BOLUS VIA INFUSION
3000.0000 [IU] | Freq: Once | INTRAVENOUS | Status: AC
Start: 2015-04-18 — End: 2015-04-18
  Administered 2015-04-18: 3000 [IU] via INTRAVENOUS
  Filled 2015-04-18: qty 3000

## 2015-04-18 MED ORDER — VITAMIN D3 25 MCG (1000 UNIT) PO TABS
1000.0000 [IU] | ORAL_TABLET | Freq: Every day | ORAL | Status: DC
  Administered 2015-04-22 – 2015-04-26 (×5): 1000 [IU] via ORAL
  Filled 2015-04-18 (×8): qty 1

## 2015-04-18 MED ORDER — ASPIRIN 81 MG PO CHEW
81.0000 mg | CHEWABLE_TABLET | ORAL | Status: AC
  Administered 2015-04-19: 81 mg via ORAL
  Filled 2015-04-18: qty 1

## 2015-04-18 MED ORDER — SODIUM CHLORIDE 0.9 % IJ SOLN
3.0000 mL | INTRAMUSCULAR | Status: DC | PRN
  Administered 2015-04-19: 3 mL via INTRAVENOUS
  Filled 2015-04-18: qty 3

## 2015-04-18 MED ORDER — HYDRALAZINE HCL 20 MG/ML IJ SOLN: 10.0000 mg | Freq: Four times a day (QID) | INTRAMUSCULAR | Status: DC | PRN

## 2015-04-18 MED ORDER — SODIUM CHLORIDE 0.9 % IV SOLN: 250.0000 mL | INTRAVENOUS | Status: DC | PRN

## 2015-04-18 MED ORDER — ASPIRIN 81 MG PO CHEW
324.0000 mg | CHEWABLE_TABLET | Freq: Once | ORAL | Status: AC
  Administered 2015-04-18: 324 mg via ORAL
  Filled 2015-04-18: qty 4

## 2015-04-18 MED ORDER — VITAMIN C 500 MG PO TABS
1000.0000 mg | ORAL_TABLET | Freq: Every day | ORAL | Status: DC
  Administered 2015-04-22 – 2015-04-26 (×5): 1000 mg via ORAL
  Filled 2015-04-18 (×7): qty 2

## 2015-04-18 MED ORDER — ONDANSETRON HCL 4 MG/2ML IJ SOLN: 4.0000 mg | Freq: Four times a day (QID) | INTRAMUSCULAR | Status: DC | PRN

## 2015-04-18 MED ORDER — CARVEDILOL 6.25 MG PO TABS
6.2500 mg | ORAL_TABLET | Freq: Two times a day (BID) | ORAL | Status: DC
  Administered 2015-04-19 – 2015-04-20 (×3): 6.25 mg via ORAL
  Filled 2015-04-18 (×5): qty 1

## 2015-04-18 MED ORDER — LISINOPRIL 10 MG PO TABS
10.0000 mg | ORAL_TABLET | Freq: Every day | ORAL | Status: DC
  Administered 2015-04-19: 10 mg via ORAL
  Filled 2015-04-18: qty 1

## 2015-04-18 MED ORDER — ADULT MULTIVITAMIN W/MINERALS CH
1.0000 | ORAL_TABLET | Freq: Every day | ORAL | Status: DC
  Filled 2015-04-18: qty 1

## 2015-04-18 MED ORDER — SODIUM CHLORIDE 0.9 % WEIGHT BASED INFUSION: 1.0000 mL/kg/h | INTRAVENOUS | Status: DC

## 2015-04-18 MED ORDER — SODIUM CHLORIDE 0.9 % IJ SOLN
3.0000 mL | Freq: Two times a day (BID) | INTRAMUSCULAR | Status: DC
  Administered 2015-04-19: 3 mL via INTRAVENOUS

## 2015-04-18 MED ORDER — AMLODIPINE BESYLATE 5 MG PO TABS: 5.0000 mg | ORAL_TABLET | Freq: Every day | ORAL | Status: DC

## 2015-04-18 MED ORDER — AMLODIPINE BESYLATE 5 MG PO TABS
5.0000 mg | ORAL_TABLET | Freq: Every day | ORAL | Status: DC
  Administered 2015-04-18 – 2015-04-19 (×2): 5 mg via ORAL
  Filled 2015-04-18 (×2): qty 1

## 2015-04-18 MED ORDER — NITROGLYCERIN 0.4 MG SL SUBL: 0.4000 mg | SUBLINGUAL_TABLET | SUBLINGUAL | Status: DC | PRN

## 2015-04-18 MED ORDER — ACETAMINOPHEN 325 MG PO TABS
650.0000 mg | ORAL_TABLET | ORAL | Status: DC | PRN
  Administered 2015-04-19: 650 mg via ORAL
  Filled 2015-04-18: qty 2

## 2015-04-18 MED ORDER — HEPARIN (PORCINE) IN NACL 100-0.45 UNIT/ML-% IJ SOLN
850.0000 [IU]/h | INTRAMUSCULAR | Status: DC
  Administered 2015-04-18: 650 [IU]/h via INTRAVENOUS
  Filled 2015-04-18: qty 250

## 2015-04-18 MED ORDER — SODIUM CHLORIDE 0.9 % WEIGHT BASED INFUSION: 3.0000 mL/kg/h | INTRAVENOUS | Status: DC

## 2015-04-18 NOTE — ED Notes (Signed)
Lab results of troponin of 0.11 reported to [facility], and  Informed me she will let Nurse know in Triage area.

## 2015-04-18 NOTE — ED Notes (Signed)
Dr Manus Gunningancour aware of troponin.  Troponin has dropped since 4/28.

## 2015-04-18 NOTE — ED Notes (Signed)
[facility] after being tx for chest pain.  She was pain free until Sat.  Nitro SL helped with the pain until today.  Nitro would not relieve pain.  She attempted to call her Dr's office but was unable to get through.

## 2015-04-18 NOTE — ED Provider Notes (Signed)
CSN: [id number]     Arrival date & time 04/18/15  1132 History   First MD Initiated Contact with Patient 04/18/15 1443     Chief Complaint  Patient presents with  . Chest Pain     (Consider location/radiation/quality/duration/timing/severity/associated sxs/prior Treatment) HPI Comments: Patient presents with chest pain that onset this morning that has been constant. It is in the center of her chest and does not radiate. It is worse with palpation and movement. She endorses some shortness of breath. No nausea, vomiting or diaphoresis. Patient admitted to the hospital 2 days ago near syncope and NSTEMI. Cardiac catheterization was recommended which she declined. She reports not having any chest pain until this morning. She took aspirin and nitroglycerin without relief. She denies any headache, dizziness, abdominal pain, nausea or vomiting. Patient with history of hypertension and recently started treatment during her last admission.  The history is provided by the patient.    Past Medical History  Diagnosis Date  . Hypertension 03/2015   Past Surgical History  Procedure Laterality Date  . Tonsillectomy     Family History  Problem Relation Age of Onset  . Protein S deficiency      "family"  . Deep vein thrombosis      "family"   History  Substance Use Topics  . Smoking status: Never Smoker   . Smokeless tobacco: Not on file  . Alcohol Use: No   OB History    No data available     Review of Systems  Constitutional: Negative for fever, activity change and appetite change.  HENT: Negative for congestion.   Eyes: Negative for visual disturbance.  Respiratory: Positive for chest tightness. Negative for cough and shortness of breath.   Cardiovascular: Positive for chest pain.  Gastrointestinal: Negative for nausea, vomiting and abdominal pain.  Genitourinary: Negative for dysuria and hematuria.  Musculoskeletal: Negative for myalgias and arthralgias.  Skin: Negative for rash.   Neurological: Negative for dizziness, weakness, light-headedness, numbness and headaches.  A complete 10 system review of systems was obtained and all systems are negative except as noted in the HPI and PMH.      Allergies  Codeine  Home Medications   Prior to Admission medications   Medication Sig Start Date End Date Taking? Authorizing Provider  amLODipine (NORVASC) 5 MG tablet Take 1 tablet (5 mg total) by mouth daily. 04/15/15   Dwana MelenaBryan W Hager, PA-C  Ascorbic Acid (VITAMIN C) 1000 MG tablet Take 1,000 mg by mouth daily.    Historical Provider, MD  aspirin EC 81 MG EC tablet Take 1 tablet (81 mg total) by mouth daily. 04/15/15   Dwana MelenaBryan W Hager, PA-C  carvedilol (COREG) 6.25 MG tablet Take 1 tablet (6.25 mg total) by mouth 2 (two) times daily with a meal. 04/15/15   Dwana MelenaBryan W Hager, PA-C  Cholecalciferol (VITAMIN D PO) Take 1 tablet by mouth daily.    Historical Provider, MD  lisinopril (PRINIVIL,ZESTRIL) 10 MG tablet Take 1 tablet (10 mg total) by mouth daily. 04/15/15   Dwana MelenaBryan W Hager, PA-C  MELATONIN PO Take 1 tablet by mouth daily as needed. For sleep per patient    Historical Provider, MD  Multiple Vitamin (MULTIVITAMIN WITH MINERALS) TABS tablet Take 1 tablet by mouth daily.    Historical Provider, MD  nitroGLYCERIN (NITROSTAT) 0.4 MG SL tablet Place 1 tablet (0.4 mg total) under the tongue every 5 (five) minutes x 3 doses as needed for chest pain. 04/15/15   Dwana MelenaBryan W Hager, PA-C  Potassium 99 MG TABS Take 1 tablet by mouth daily.    Historical Provider, MD   BP 180/59 mmHg  Pulse 61  Temp(Src) 98.2 F (36.8 C)  Resp 13  Ht  (1.626 m)  Wt 129 lb (58.514 kg)  BMI 22.13 kg/m2  SpO2 97% Physical Exam  Constitutional: She is oriented to person, place, and time. She appears well-developed and well-nourished. No distress.  Anxious appearing./  HENT:  Head: Normocephalic and atraumatic.  Mouth/Throat: Oropharynx is clear and moist. No oropharyngeal exudate.  Eyes: Conjunctivae and  EOM are normal. Pupils are equal, round, and reactive to light.  Neck: Normal range of motion. Neck supple.  No meningismus.  Cardiovascular: Normal rate, regular rhythm, normal heart sounds and intact distal pulses.   No murmur heard. Pulmonary/Chest: Effort normal and breath sounds normal. No respiratory distress. She exhibits tenderness.  TTP L chest wall, reproduces pain.  Abdominal: Soft. There is no tenderness. There is no rebound and no guarding.  Musculoskeletal: Normal range of motion. She exhibits no edema or tenderness.  Neurological: She is alert and oriented to person, place, and time. No cranial nerve deficit. She exhibits normal muscle tone. Coordination normal.  No ataxia on finger to nose bilaterally. No pronator drift. 5/5 strength throughout. CN 2-12 intact. Negative Romberg. Equal grip strength. Sensation intact. Gait is normal.   Skin: Skin is warm.  Psychiatric: She has a normal mood and affect. Her behavior is normal.  Nursing note and vitals reviewed.   ED Course  Procedures (including critical care time) Labs Review Labs Reviewed  COMPREHENSIVE METABOLIC PANEL - Abnormal; Notable for the following:    ALT 12 (*)    All other components within normal limits  I-STAT TROPOININ, ED - Abnormal; Notable for the following:    Troponin i, poc 0.11 (*)    All other components within normal limits  CBC  HEPARIN LEVEL (UNFRACTIONATED)    Imaging Review Dg Chest 2 View  04/18/2015   CLINICAL DATA:  Left side chest pain for 3 days  EXAM: CHEST  2 VIEW  COMPARISON:  None.  FINDINGS: Cardiomediastinal silhouette is unremarkable. No acute infiltrate or pleural effusion. No pulmonary edema. Mild thoracic spine osteopenia. Mild degenerative changes mid thoracic spine.  IMPRESSION: No active cardiopulmonary disease.   Electronically Signed   By: Natasha Mead M.D.   On: 04/18/2015 12:37     EKG Interpretation   Date/Time:  Monday Apr 18 2015 11:41:26 EDT Ventricular Rate:   61 PR Interval:  158 QRS Duration: 98 QT Interval:  414 QTC Calculation: 416 R Axis:   65 Text Interpretation:  Normal sinus rhythm Septal infarct , age  undetermined Abnormal ECG No significant change was found Confirmed by  Manus Gunning  MD, Lyriq Finerty [phone]) on 04/18/2015 3:07:19 PM      MDM   Final diagnoses:  Acute coronary syndrome    Chest pain since this morning. Somewhat reproducible. Blood pressure uncontrolled. Pain does not radiate. No shortness of breath, nausea or vomiting.  EKG is unchanged with septal Q waves. Chest x-ray negative.  Aspirin and nitroglycerin given. Started on nitroglycerin and heparin drip due to concern for unstable angina.  Troponin 0.11. This is decreased from her troponin discharge 0.5.  Discussed with cardiology as patient likely needs admission for catheterization. Doubt PE, doubt aortic dissection.   CRITICAL CARE Performed by: Glynn Octave Total critical care time: 40 Critical care time was exclusive of separately billable procedures and treating other patients. Critical  care was necessary to treat or prevent imminent or life-threatening deterioration. Critical care was time spent personally by me on the following activities: development of treatment plan with patient and/or surrogate as well as nursing, discussions with consultants, evaluation of patient's response to treatment, examination of patient, obtaining history from patient or surrogate, ordering and performing treatments and interventions, ordering and review of laboratory studies, ordering and review of radiographic studies, pulse oximetry and re-evaluation of patient's condition.    Glynn Octave, MD 04/18/15 [phone]

## 2015-04-18 NOTE — H&P (Signed)
Patient ID: Alexis Moss MRN: [id number], DOB/AGE: 08-20-1941   Admit date: 04/18/2015   Primary Physician: No primary care provider on file. Primary Cardiologist: Dr Anne FuSkains  Pt. Profile:  Alexis Moss is a 74 y.o. female with recently discharged from hospital 4/29 for NSTEMI who [facility] for evaluation of unrelieved chest pain.  HPI:  She was admitted 4/28 for sudden onset of fatigue, hot feeling/diaphoresis and "feeling odd" while doing a training for a new part time job at a [employer].Troponin peaked at 0.82. LDL 172 but patient refuses statin. TSH was WNL. CT head negative. Echo revealed EF of 65-70%, moderate LVH, mitral annular calcification with mild mitral regurgitation. Noted to have right > left carotid bruit. Dr. Anne FuSkains recommended to perform cardiac catheterization to define her anatomy in the setting of what could be supply demand mismatch. However, she was quite hesitant about proceeding in this direction. She was discharged next day with a goal to control HTN and to established care [place]. In the meantime, Dr. Anne FuSkains will see her back in clinic until this is established and f/u carotid bruit as outpatient. She was discharged stable on Coreg 6.25 mg twice a day, amlodipine 5 g once a day, lisinopril 10 mg once a day and aspirin 81 mg a day.  SL nitro helped her to relieved pain over weekend. Today she woke up with a substernal chest pain like a "bruise". No associated SOB, nausea or radiation of pain. Her presented to ED after SL nitro X 3 did not relieved her pain.   In ED her POC trop is 0.11, peaked at 0.82 last admission. Lytes normal. CXR without active cardiopulmonary disease. She started on nitro gtt and heparin. Given ASA 324mg . Her SBP in 200s range. She wants to processed with cath. She only ate one bananna at 8 AM.   Problem List  Past Medical History  Diagnosis Date  . Hypertension 03/2015      Past Surgical History  Procedure Laterality Date  . Tonsillectomy      Allergies  Allergies  Allergen Reactions  . Codeine     Makes me crazy     Home Medications  Prior to Admission medications   Medication Sig Start Date End Date Taking? Authorizing Provider  amLODipine (NORVASC) 5 MG tablet Take 1 tablet (5 mg total) by mouth daily. 04/15/15   Dwana MelenaBryan W Hager, PA-C  Ascorbic Acid (VITAMIN C) 1000 MG tablet Take 1,000 mg by mouth daily.    Historical Provider, MD  aspirin EC 81 MG EC tablet Take 1 tablet (81 mg total) by mouth daily. 04/15/15   Dwana MelenaBryan W Hager, PA-C  carvedilol (COREG) 6.25 MG tablet Take 1 tablet (6.25 mg total) by mouth 2 (two) times daily with a meal. 04/15/15   Dwana MelenaBryan W Hager, PA-C  Cholecalciferol (VITAMIN D PO) Take 1 tablet by mouth daily.    Historical Provider, MD  lisinopril (PRINIVIL,ZESTRIL) 10 MG tablet Take 1 tablet (10 mg total) by mouth daily. 04/15/15   Dwana MelenaBryan W Hager, PA-C  MELATONIN PO Take 1 tablet by mouth daily as needed. For sleep per patient    Historical Provider, MD  Multiple Vitamin (MULTIVITAMIN WITH MINERALS) TABS tablet Take 1 tablet by mouth daily.    Historical Provider, MD  nitroGLYCERIN (NITROSTAT) 0.4 MG SL tablet Place 1 tablet (0.4 mg total) under the tongue every 5 (five) minutes x 3 doses as needed for chest  pain. 04/15/15   Dwana Melena, PA-C  Potassium 99 MG TABS Take 1 tablet by mouth daily.    Historical Provider, MD    Family History  Family History  Problem Relation Age of Onset  . Protein S deficiency      "family"  . Deep vein thrombosis      "family"   No family status information on file.     Social History  History   Social History  . Marital Status: Married    Spouse Name: N/A  . Number of Children: N/A  . Years of Education: N/A   Occupational History  . Not on file.   Social History Main Topics  . Smoking status: Never Smoker   . Smokeless tobacco: Not on file  . Alcohol Use: No  . Drug Use: No   . Sexual Activity: Not on file   Other Topics Concern  . Not on file   Social History Narrative     Review of Systems  All other systems reviewed and are otherwise negative except as noted above.  Physical Exam  Blood pressure 205/69, pulse 60, temperature 98.2 F (36.8 C), resp. rate 11, height  (1.626 m), weight 129 lb (58.514 kg), SpO2 100 %.  General: Pleasant, NAD Psych: Normal affect. Neuro: Alert and oriented X 3. Moves all extremities spontaneously. HEENT: Normal  Neck: Supple without JVD. bilateral carotid bruits, R>L. Lungs:  Resp regular and unlabored, CTA. Heart: RRR no s3, s4, or murmurs. Abdomen: Soft, non-tender, non-distended, BS + x 4.  Extremities: No clubbing, cyanosis or edema. DP/PT/Radials 2+ and equal bilaterally.  Labs  Lab Results  Component Value Date   WBC 6.1 04/18/2015   HGB 12.3 04/18/2015   HCT 38.1 04/18/2015   MCV 86.4 04/18/2015   PLT 315 04/18/2015     Recent Labs Lab 04/18/15 1154  NA 140  K 4.6  CL 102  CO2 28  BUN 11  CREATININE 0.76  CALCIUM 9.6  PROT 7.4  BILITOT 0.5  ALKPHOS 78  ALT 12*  AST 17  GLUCOSE 88   Lab Results  Component Value Date   CHOL 230* 04/15/2015   HDL 41 04/15/2015   LDLCALC 172* 04/15/2015   TRIG 87 04/15/2015    Radiology/Studies  Dg Chest 2 View  04/18/2015   CLINICAL DATA:  Left side chest pain for 3 days  EXAM: CHEST  2 VIEW  COMPARISON:  None.  FINDINGS: Cardiomediastinal silhouette is unremarkable. No acute infiltrate or pleural effusion. No pulmonary edema. Mild thoracic spine osteopenia. Mild degenerative changes mid thoracic spine.  IMPRESSION: No active cardiopulmonary disease.   Electronically Signed   By: Natasha Mead M.D.   On: 04/18/2015 12:37   Ct Head Wo Contrast  04/15/2015   CLINICAL DATA:  74 year old female with 3 day history of frontal headache and near syncope  EXAM: CT HEAD WITHOUT CONTRAST  TECHNIQUE: Contiguous axial images were obtained from the base of the  skull through the vertex without intravenous contrast.  COMPARISON:  None.  FINDINGS: Negative for acute intracranial hemorrhage, acute infarction, mass, mass effect, hydrocephalus or midline shift. Gray-white differentiation is preserved throughout. No focal soft tissue or calvarial abnormality. Globes and orbits are symmetric bilaterally. Normal aeration of the mastoid air cells and visualized paranasal sinuses. Atherosclerotic calcification present in both cavernous carotid arteries.  IMPRESSION: 1. No acute intracranial abnormality. 2. Intracranial atherosclerosis.   Electronically Signed   By: Malachy Moan M.D.   On:  04/15/2015 00:35   2 D echo 04/15/15 Study Conclusions  - Left ventricle: The cavity size was normal. Wall thickness was increased in a pattern of moderate LVH. Systolic function was vigorous. The estimated ejection fraction was in the range of 65% to 70%. Doppler parameters are consistent with abnormal left ventricular relaxation (grade 1 diastolic dysfunction). - Aortic valve: There was mild regurgitation. - Mitral valve: MV is thickened with signifiant annular calcification. In the apical 4 chamber view there is increased shadowing of valve into the LA that may reflect reverberation artifact vs prolapse. Cannot completely exclude mass, though less likely since motion is uniform from beat to beat. Clinical correlation indicated. No old echoes to compare. Calcified annulus. Mildly thickened leaflets . There was mild regurgitation. - Right ventricle: The cavity size was mildly dilated. - Pulmonary arteries: PA peak pressure: 31 mm Hg (S).   ECG NSR without acute abnormality.   ASSESSMENT AND PLAN Principal Problem:   NSTEMI (non-ST elevated myocardial infarction) Active Problems:   Hypertension   Hypertensive urgency   Bilateral carotid bruits   74 yr old woman with presented with hypertensive emergency and NSTEMI. She was discharged 4/29  stable with plan to f/u as outpatient. Nitro did helped over weekend. She presented to ED after 3 X SL nitro did not helped to relieved her pain.  POC trop is 0.11, peaked at 0.82 last admission. She started on nitro gtt, heparin and ASA . She pain is better. Will do cath today or tomorrow.   Signed, Manson Passey, PA-C 04/18/2015, 4:34 PM  Patient seen and examined  I agree with findings as noted by B Bhagat above.   Patient just d/c'd a few days ago  Had positive trop but refused cath. Returns after a few episoded of chest pain  Agrees to proceed with L heart cath  Discussed with patient  Plan for tomorrow.  For now continue outpt meds, heparin and NTG. Patient with carotid bruits  Will sched USN Fhx of protein S deficiency  Patient without history of clots  Requests that it be checked  Should be on statin  Has refused  Will need to discuss with her once more data back.  Dietrich Pates

## 2015-04-18 NOTE — Progress Notes (Signed)
ANTICOAGULATION CONSULT NOTE - Initial Consult  Pharmacy Consult for Heparin  Indication: chest pain/ACS  Allergies  Allergen Reactions  . Codeine     Makes me crazy    Patient Measurements: Height: 5\' 4"  (162.6 cm) Weight: 129 lb (58.514 kg) IBW/kg (Calculated) : 54.7  Vital Signs: Temp: 98.2 F (36.8 C) (05/02 1141) BP: 204/67 mmHg (05/02 1515) Pulse Rate: 62 (05/02 1515)  Labs:  Recent Labs  04/18/15 1154  HGB 12.3  HCT 38.1  PLT 315  CREATININE 0.76    Estimated Creatinine Clearance: 54.9 mL/min (by C-G formula based on Cr of 0.76).   Medical History: Past Medical History  Diagnosis Date  . Hypertension 03/2015    Medications:   (Not in a hospital admission)  Assessment: [id number] YOF who was [facility] after being treated for chest pain. Patient presents to ED again with CC of unrelieved chest pain. Troponin on admission was elevated at 0.11. Pharmacy consulted to start IV heparin for ACS. CBC wnl. Patient is not on any anticoagulation prior to admission.   Goal of Therapy:  Heparin level 0.3-0.7 units/ml Monitor platelets by anticoagulation protocol: Yes   Plan:  -Heparin 3000 units IV bolus once followed by heparin infusion at 650 units/hr  -F/u 8 hr HL  -Monitor daily HL, CBC and s/s of bleeding -Patient wants to proceed with cath   Vinnie LevelBenjamin Reighlyn Elmes, PharmD., BCPS Clinical Pharmacist Pager [phone]

## 2015-04-18 NOTE — ED Notes (Signed)
Meal ordered for Pt

## 2015-04-18 NOTE — ED Notes (Signed)
Patient denies pain and is resting comfortably.  

## 2015-04-18 NOTE — ED Notes (Signed)
Pt eating her supper; refusing to let tech attach transport monitor at this time.

## 2015-04-18 NOTE — ED Notes (Signed)
Attempted IV for heparin and pt continually jerked arm, not able to get IV at this time.  This Rn asked team mate to attempt Iv with assistance when able.

## 2015-04-18 NOTE — ED Notes (Signed)
PT still eating.

## 2015-04-19 ENCOUNTER — Inpatient Hospital Stay (HOSPITAL_COMMUNITY): Payer: Medicare Other

## 2015-04-19 ENCOUNTER — Encounter (HOSPITAL_COMMUNITY)
Admission: EM | Disposition: A | Discharge status: Skilled Nursing Facility | Payer: Medicare Other | Source: Home / Self Care | Attending: Thoracic Surgery (Cardiothoracic Vascular Surgery)

## 2015-04-19 ENCOUNTER — Encounter (HOSPITAL_COMMUNITY): Payer: Medicare Other

## 2015-04-19 ENCOUNTER — Other Ambulatory Visit: Payer: Self-pay | Admitting: *Deleted

## 2015-04-19 ENCOUNTER — Observation Stay (HOSPITAL_COMMUNITY): Payer: Medicare Other

## 2015-04-19 DIAGNOSIS — Z7982 Long term (current) use of aspirin: Secondary | ICD-10-CM | POA: Diagnosis not present

## 2015-04-19 DIAGNOSIS — D62 Acute posthemorrhagic anemia: Secondary | ICD-10-CM | POA: Diagnosis not present

## 2015-04-19 DIAGNOSIS — R110 Nausea: Secondary | ICD-10-CM | POA: Diagnosis not present

## 2015-04-19 DIAGNOSIS — I2510 Atherosclerotic heart disease of native coronary artery without angina pectoris: Secondary | ICD-10-CM

## 2015-04-19 DIAGNOSIS — E8770 Fluid overload, unspecified: Secondary | ICD-10-CM | POA: Diagnosis not present

## 2015-04-19 DIAGNOSIS — I214 Non-ST elevation (NSTEMI) myocardial infarction: Secondary | ICD-10-CM | POA: Diagnosis present

## 2015-04-19 DIAGNOSIS — E785 Hyperlipidemia, unspecified: Secondary | ICD-10-CM | POA: Diagnosis present

## 2015-04-19 DIAGNOSIS — Z79899 Other long term (current) drug therapy: Secondary | ICD-10-CM | POA: Diagnosis not present

## 2015-04-19 DIAGNOSIS — R079 Chest pain, unspecified: Secondary | ICD-10-CM | POA: Diagnosis present

## 2015-04-19 DIAGNOSIS — I10 Essential (primary) hypertension: Secondary | ICD-10-CM | POA: Diagnosis present

## 2015-04-19 DIAGNOSIS — Z832 Family history of diseases of the blood and blood-forming organs and certain disorders involving the immune mechanism: Secondary | ICD-10-CM | POA: Diagnosis not present

## 2015-04-19 DIAGNOSIS — Z951 Presence of aortocoronary bypass graft: Secondary | ICD-10-CM | POA: Diagnosis not present

## 2015-04-19 DIAGNOSIS — E876 Hypokalemia: Secondary | ICD-10-CM | POA: Diagnosis not present

## 2015-04-19 DIAGNOSIS — I4510 Unspecified right bundle-branch block: Secondary | ICD-10-CM | POA: Diagnosis present

## 2015-04-19 DIAGNOSIS — R0989 Other specified symptoms and signs involving the circulatory and respiratory systems: Secondary | ICD-10-CM | POA: Diagnosis present

## 2015-04-19 DIAGNOSIS — I25110 Atherosclerotic heart disease of native coronary artery with unstable angina pectoris: Secondary | ICD-10-CM | POA: Diagnosis present

## 2015-04-19 HISTORY — DX: Hyperlipidemia, unspecified: E78.5

## 2015-04-19 HISTORY — PX: CARDIAC CATHETERIZATION: SHX172

## 2015-04-19 LAB — PROTIME-INR
INR: 1.09 (ref 0.00–1.49)
Prothrombin Time: 14.2 seconds (ref 11.6–15.2)

## 2015-04-19 LAB — BASIC METABOLIC PANEL
ANION GAP: 13 (ref 5–15)
BUN: 9 mg/dL (ref 6–20)
CO2: 26 mmol/L (ref 22–32)
Calcium: 9.2 mg/dL (ref 8.9–10.3)
Chloride: 101 mmol/L (ref 101–111)
Creatinine, Ser: 0.62 mg/dL (ref 0.44–1.00)
GFR calc Af Amer: >60 mL/min (ref >60)
Glucose, Bld: 84 mg/dL (ref 70–99)
POTASSIUM: 3.6 mmol/L (ref 3.5–5.1)
SODIUM: 140 mmol/L (ref 135–145)

## 2015-04-19 LAB — URINALYSIS, ROUTINE W REFLEX MICROSCOPIC
BILIRUBIN URINE: NEGATIVE
Glucose, UA: NEGATIVE mg/dL
Hgb urine dipstick: NEGATIVE
KETONES UR: NEGATIVE mg/dL
Leukocytes, UA: NEGATIVE
NITRITE: NEGATIVE
Protein, ur: NEGATIVE mg/dL
Specific Gravity, Urine: 1.01 (ref 1.005–1.030)
Urobilinogen, UA: 0.2 mg/dL (ref 0.0–1.0)
pH: 7 (ref 5.0–8.0)

## 2015-04-19 LAB — PULMONARY FUNCTION TEST
FEF 25-75 Post: 1.91 L/sec
FEF 25-75 Pre: 1.6 L/sec
FEF2575-%Change-Post: 18 %
FEF2575-%Pred-Post: 108 %
FEF2575-%Pred-Pre: 91 %
FEV1-%CHANGE-POST: 7 %
FEV1-%PRED-PRE: 69 %
FEV1-%Pred-Post: 74 %
FEV1-Post: 1.62 L
FEV1-Pre: 1.5 L
FEV1FVC-%CHANGE-POST: 0 %
FEV1FVC-%Pred-Pre: 109 %
FEV6-%Change-Post: 5 %
FEV6-%PRED-POST: 70 %
FEV6-%PRED-PRE: 66 %
FEV6-PRE: 1.83 L
FEV6-Post: 1.94 L
FEV6FVC-%PRED-POST: 105 %
FEV6FVC-%PRED-PRE: 105 %
FVC-%Change-Post: 7 %
FVC-%Pred-Post: 68 %
FVC-%Pred-Pre: 63 %
FVC-Post: 1.97 L
FVC-Pre: 1.83 L
POST FEV6/FVC RATIO: 100 %
Post FEV1/FVC ratio: 82 %
Pre FEV1/FVC ratio: 82 %
Pre FEV6/FVC Ratio: 100 %

## 2015-04-19 LAB — ABO/RH: ABO/RH(D): A POS

## 2015-04-19 LAB — BLOOD GAS, ARTERIAL
ACID-BASE EXCESS: 1.8 mmol/L (ref 0.0–2.0)
Bicarbonate: 25.8 mEq/L — ABNORMAL HIGH (ref 20.0–24.0)
DRAWN BY: 419771
FIO2: 0.21 %
O2 SAT: 95.7 %
Patient temperature: 98.6
TCO2: 27.1 mmol/L (ref 0–100)
pCO2 arterial: 40.3 mmHg (ref 35.0–45.0)
pH, Arterial: 7.423 (ref 7.350–7.450)
pO2, Arterial: 80.8 mmHg (ref 80.0–100.0)

## 2015-04-19 LAB — HEPARIN LEVEL (UNFRACTIONATED)
HEPARIN UNFRACTIONATED: 0.7 [IU]/mL (ref 0.30–0.70)
Heparin Unfractionated: 0.17 IU/mL — ABNORMAL LOW (ref 0.30–0.70)

## 2015-04-19 LAB — CBC
HCT: 37.8 % (ref 36.0–46.0)
Hemoglobin: 12.3 g/dL (ref 12.0–15.0)
MCH: 27.6 pg (ref 26.0–34.0)
MCHC: 32.5 g/dL (ref 30.0–36.0)
MCV: 84.9 fL (ref 78.0–100.0)
PLATELETS: 285 10*3/uL (ref 150–400)
RBC: 4.45 MIL/uL (ref 3.87–5.11)
RDW: 13.3 % (ref 11.5–15.5)
WBC: 6.4 10*3/uL (ref 4.0–10.5)

## 2015-04-19 LAB — POCT ACTIVATED CLOTTING TIME: Activated Clotting Time: 270 seconds

## 2015-04-19 LAB — APTT: aPTT: 33 seconds (ref 24–37)

## 2015-04-19 LAB — SURGICAL PCR SCREEN
MRSA, PCR: NEGATIVE
Staphylococcus aureus: POSITIVE — AB

## 2015-04-19 LAB — TROPONIN I
TROPONIN I: 0.07 ng/mL — AB (ref <0.031)
Troponin I: 0.1 ng/mL — ABNORMAL HIGH (ref <0.031)

## 2015-04-19 SURGERY — LEFT HEART CATH AND CORONARY ANGIOGRAPHY
Anesthesia: LOCAL

## 2015-04-19 MED ORDER — CHLORHEXIDINE GLUCONATE 4 % EX LIQD
Freq: Two times a day (BID) | CUTANEOUS | Status: AC
Start: 2015-04-19 — End: 2015-04-20
  Administered 2015-04-19 – 2015-04-20 (×2): via TOPICAL
  Filled 2015-04-19 (×2): qty 60

## 2015-04-19 MED ORDER — CEFUROXIME SODIUM 750 MG IJ SOLR
750.0000 mg | INTRAMUSCULAR | Status: DC
  Filled 2015-04-19: qty 750

## 2015-04-19 MED ORDER — EPINEPHRINE HCL 1 MG/ML IJ SOLN
0.0000 ug/min | INTRAVENOUS | Status: DC
  Filled 2015-04-19: qty 4

## 2015-04-19 MED ORDER — SODIUM CHLORIDE 0.9 % IV SOLN: 250.0000 mL | INTRAVENOUS | Status: DC | PRN

## 2015-04-19 MED ORDER — MIDAZOLAM HCL 2 MG/2ML IJ SOLN
INTRAMUSCULAR | Status: AC
  Filled 2015-04-19: qty 2

## 2015-04-19 MED ORDER — DOPAMINE-DEXTROSE 3.2-5 MG/ML-% IV SOLN
0.0000 ug/kg/min | INTRAVENOUS | Status: DC
  Filled 2015-04-19: qty 250

## 2015-04-19 MED ORDER — MIDAZOLAM HCL 2 MG/2ML IJ SOLN
INTRAMUSCULAR | Status: DC | PRN
  Administered 2015-04-19: 1 mg via INTRAVENOUS
  Administered 2015-04-19: 2 mg via INTRAVENOUS

## 2015-04-19 MED ORDER — NITROGLYCERIN 1 MG/10 ML FOR IR/CATH LAB
INTRA_ARTERIAL | Status: AC
Start: 2015-04-19 — End: 2015-04-19
  Filled 2015-04-19: qty 10

## 2015-04-19 MED ORDER — HEPARIN SODIUM (PORCINE) 1000 UNIT/ML IJ SOLN
INTRAMUSCULAR | Status: DC | PRN
  Administered 2015-04-19 (×2): 3000 [IU] via INTRAVENOUS

## 2015-04-19 MED ORDER — SODIUM CHLORIDE 0.9 % IJ SOLN: 3.0000 mL | INTRAMUSCULAR | Status: DC | PRN

## 2015-04-19 MED ORDER — ALBUTEROL SULFATE (2.5 MG/3ML) 0.083% IN NEBU
2.5000 mg | INHALATION_SOLUTION | Freq: Once | RESPIRATORY_TRACT | Status: AC
  Administered 2015-04-19: 2.5 mg via RESPIRATORY_TRACT

## 2015-04-19 MED ORDER — SODIUM CHLORIDE 0.9 % IV SOLN
INTRAVENOUS | Status: DC
  Filled 2015-04-19: qty 2.5

## 2015-04-19 MED ORDER — FENTANYL CITRATE (PF) 100 MCG/2ML IJ SOLN
INTRAMUSCULAR | Status: AC
  Filled 2015-04-19: qty 2

## 2015-04-19 MED ORDER — LIDOCAINE HCL (PF) 1 % IJ SOLN
INTRAMUSCULAR | Status: AC
  Filled 2015-04-19: qty 30

## 2015-04-19 MED ORDER — PLASMA-LYTE 148 IV SOLN
INTRAVENOUS | Status: DC
  Filled 2015-04-19: qty 2.5

## 2015-04-19 MED ORDER — MAGNESIUM SULFATE 50 % IJ SOLN
40.0000 meq | INTRAMUSCULAR | Status: DC
  Filled 2015-04-19: qty 10

## 2015-04-19 MED ORDER — DEXTROSE 5 % IV SOLN
1.5000 g | INTRAVENOUS | Status: DC
  Filled 2015-04-19: qty 1.5

## 2015-04-19 MED ORDER — HEPARIN (PORCINE) IN NACL 100-0.45 UNIT/ML-% IJ SOLN
800.0000 [IU]/h | INTRAMUSCULAR | Status: DC
  Administered 2015-04-20: 800 [IU]/h via INTRAVENOUS
  Filled 2015-04-19 (×2): qty 250

## 2015-04-19 MED ORDER — SODIUM CHLORIDE 0.9 % IJ SOLN: 3.0000 mL | Freq: Two times a day (BID) | INTRAMUSCULAR | Status: DC

## 2015-04-19 MED ORDER — POTASSIUM CHLORIDE 2 MEQ/ML IV SOLN
80.0000 meq | INTRAVENOUS | Status: DC
  Filled 2015-04-19: qty 40

## 2015-04-19 MED ORDER — PHENYLEPHRINE HCL 10 MG/ML IJ SOLN
30.0000 ug/min | INTRAVENOUS | Status: DC
  Filled 2015-04-19: qty 2

## 2015-04-19 MED ORDER — VERAPAMIL HCL 2.5 MG/ML IV SOLN
INTRAVENOUS | Status: AC
  Filled 2015-04-19: qty 2

## 2015-04-19 MED ORDER — SODIUM CHLORIDE 0.9 % WEIGHT BASED INFUSION
3.0000 mL/kg/h | INTRAVENOUS | Status: AC
  Administered 2015-04-19 (×2): 3 mL/kg/h via INTRAVENOUS

## 2015-04-19 MED ORDER — DEXMEDETOMIDINE HCL IN NACL 400 MCG/100ML IV SOLN
0.1000 ug/kg/h | INTRAVENOUS | Status: DC
  Filled 2015-04-19: qty 100

## 2015-04-19 MED ORDER — IOHEXOL 350 MG/ML SOLN
INTRAVENOUS | Status: DC | PRN
  Administered 2015-04-19: 100 mL via INTRA_ARTERIAL
  Administered 2015-04-19: 50 mL via INTRA_ARTERIAL

## 2015-04-19 MED ORDER — VANCOMYCIN HCL 10 G IV SOLR
1250.0000 mg | INTRAVENOUS | Status: DC
  Filled 2015-04-19: qty 1250

## 2015-04-19 MED ORDER — SODIUM CHLORIDE 0.9 % IV SOLN
INTRAVENOUS | Status: DC
  Filled 2015-04-19: qty 30

## 2015-04-19 MED ORDER — HEPARIN SODIUM (PORCINE) 1000 UNIT/ML IJ SOLN
INTRAMUSCULAR | Status: AC
  Filled 2015-04-19: qty 1

## 2015-04-19 MED ORDER — SODIUM CHLORIDE 0.9 % IV SOLN
INTRAVENOUS | Status: DC
  Filled 2015-04-19: qty 40

## 2015-04-19 MED ORDER — FENTANYL CITRATE (PF) 100 MCG/2ML IJ SOLN
INTRAMUSCULAR | Status: DC | PRN
  Administered 2015-04-19 (×2): 25 ug via INTRAVENOUS

## 2015-04-19 MED ORDER — HEPARIN BOLUS VIA INFUSION
2000.0000 [IU] | Freq: Once | INTRAVENOUS | Status: AC
  Administered 2015-04-19: 2000 [IU] via INTRAVENOUS
  Filled 2015-04-19: qty 2000

## 2015-04-19 MED ORDER — HEPARIN (PORCINE) IN NACL 2-0.9 UNIT/ML-% IJ SOLN
INTRAMUSCULAR | Status: AC
  Filled 2015-04-19: qty 1000

## 2015-04-19 MED ORDER — VERAPAMIL HCL 2.5 MG/ML IV SOLN
INTRAVENOUS | Status: DC | PRN
  Administered 2015-04-19: 14:00:00 via INTRA_ARTERIAL

## 2015-04-19 MED ORDER — NITROGLYCERIN IN D5W 200-5 MCG/ML-% IV SOLN
2.0000 ug/min | INTRAVENOUS | Status: DC
  Filled 2015-04-19: qty 250

## 2015-04-19 SURGICAL SUPPLY — 15 items
CATH INFINITI 5 FR JL3.5 (CATHETERS) ×2 IMPLANT
CATH INFINITI 5FR ANG PIGTAIL (CATHETERS) ×2 IMPLANT
CATH INFINITI JR4 5F (CATHETERS) ×2 IMPLANT
CATH OPTICROSS 40MHZ (CATHETERS) ×2 IMPLANT
CATH VISTA GUIDE 6FRXBLAD3.5SH (CATHETERS) ×2 IMPLANT
DEVICE RAD COMP TR BAND LRG (VASCULAR PRODUCTS) ×2 IMPLANT
GLIDESHEATH SLEND SS 6F .021 (SHEATH) ×2 IMPLANT
KIT HEART LEFT (KITS) ×2 IMPLANT
PACK CARDIAC CATHETERIZATION (CUSTOM PROCEDURE TRAY) ×2 IMPLANT
SLED PULL BACK IVUS (MISCELLANEOUS) ×2 IMPLANT
SYR MEDRAD MARK V 150ML (SYRINGE) ×2 IMPLANT
TRANSDUCER W/STOPCOCK (MISCELLANEOUS) ×2 IMPLANT
TUBING CIL FLEX 10 FLL-RA (TUBING) ×2 IMPLANT
WIRE ASAHI SOFT 180CM (WIRE) ×2 IMPLANT
WIRE SAFE-T 1.5MM-J .035X260CM (WIRE) ×4 IMPLANT

## 2015-04-19 NOTE — Progress Notes (Signed)
Chaplain responded to page that pt requested chaplain visit.  Pt and Nurse Tech in room.  Pt is having major surgery tomorrow and wanted to talk about it with chaplain.  Pt reports being "B[employer]al" which is a combination of [employer] and [employer] religious practices.  Pt requested prayer, chaplain provided emotional and spiritual support through empathetic listening, hospitality, presence and prayer.  Chaplain will follow up as needed.    04/19/15 2045  Clinical Encounter Type  Visited With Patient;Health care provider  Visit Type Initial;Spiritual support  Referral From Patient  Spiritual Encounters  Spiritual Needs Sacred text;Prayer;Emotional  Stress Factors  Patient Stress Factors Exhausted;Health changes;Loss of control   Blain PaisOvercash, Keltie Labell A, Chaplain 04/19/2015

## 2015-04-19 NOTE — Progress Notes (Addendum)
ANTICOAGULATION CONSULT NOTE - Follow Up Consult  Pharmacy Consult for Heparin Indication: chest pain/ACS/NSTEMI  Allergies  Allergen Reactions  . Codeine     Makes me crazy    Patient Measurements: Height: 5\' 4"  (162.6 cm) Weight: 129 lb 3.2 oz (58.605 kg) IBW/kg (Calculated) : 54.7 Heparin Dosing Weight: 58.6 kg  Vital Signs: Temp: 97.6 F (36.4 C) (05/03 0514) Temp Source: Oral (05/03 0514) BP: 188/52 mmHg (05/03 0853) Pulse Rate: 65 (05/03 0403)  Labs:  Recent Labs  04/18/15 1154 04/18/15 1952 04/18/15 2355 04/19/15 0126 04/19/15 0750  HGB 12.3  --   --   --  12.3  HCT 38.1  --   --   --  37.8  PLT 315  --   --   --  285  LABPROT  --   --   --   --  14.2  INR  --   --   --   --  1.09  HEPARINUNFRC  --   --  0.17*  --  0.70  CREATININE 0.76  --   --   --  0.62  TROPONINI  --  0.11*  --  0.10* 0.07*    Estimated Creatinine Clearance: 54.9 mL/min (by C-G formula based on Cr of 0.62).  Assessment:   Heparin level is now therapeutic (0.70) on 850 units/hr.   For cardiac cath later today.  Goal of Therapy:  Heparin level 0.3-0.7 units/ml Monitor platelets by anticoagulation protocol: Yes   Plan:   Continue heparin drip at 850 units/hr.  Daily heparin level and CBC while on heparin.  Will follow-up post-cath.  Dennie Fettersgan, Kyann Heydt Donovan, [place] Pager: [phone] 04/19/2015,11:08 AM  Addendum:  Cyndy FreezePost-cath.  Left main and 3-vessel disease noted. TCTS to see.  Heparin to resume 8 hrs after sheath out.  Sheath out ~2:30pm.  Estimate TR band will be deflated by ~4:30pm, so will resume heparin at 12:30am on 5/4.    Plan:    Resume heparin at 800 units/hr at ~12:30am on 5/4    Heparin level and CBC ~6:30am on 5/4 and daily.  Hilarie Fredrickson. Zissel Biederman, RPh 3:17 PM

## 2015-04-19 NOTE — H&P (View-Only) (Signed)
Patient Name: Alexis Moss Date of Encounter: 04/19/2015   SUBJECTIVE  Denies chest pain, SOB, nausea or headache.  CURRENT MEDS . amLODipine  5 mg Oral Daily  . amLODipine  5 mg Oral Daily  . aspirin EC  81 mg Oral Daily  . carvedilol  6.25 mg Oral BID WC  . cholecalciferol  1,000 Units Oral Daily  . lisinopril  10 mg Oral Daily  . multivitamin with minerals  1 tablet Oral Daily  . sodium chloride  3 mL Intravenous Q12H  . vitamin C  1,000 mg Oral Daily    OBJECTIVE  Filed Vitals:   04/19/15 0003 04/19/15 0024 04/19/15 0403 04/19/15 0514  BP: 161/59  134/51   Pulse: 66  65   Temp:  97.9 F (36.6 C)  97.6 F (36.4 C)  TempSrc:  Oral  Oral  Resp: 10  15   Height:      Weight:      SpO2: 98%  94%     Intake/Output Summary (Last 24 hours) at 04/19/15 0816 Last data filed at 04/19/15 0810  Gross per 24 hour  Intake      0 ml  Output   1125 ml  Net  -1125 ml   Filed Weights   04/18/15 1141 04/18/15 2029  Weight: 129 lb (58.514 kg) 129 lb 3.2 oz (58.605 kg)    PHYSICAL EXAM  General: Pleasant, NAD. Neuro: Alert and oriented X 3. Moves all extremities spontaneously. Psych: Normal affect. HEENT:  Normal  Neck: Supple without JVD. bilateral carotid bruits, R>L.. Lungs:  Resp regular and unlabored, CTA. Heart: RRR no s3, s4, or murmurs. Abdomen: Soft, non-tender, non-distended, BS + x 4.  Extremities: No clubbing, cyanosis or edema. DP/PT/Radials 2+ and equal bilaterally.  Accessory Clinical Findings  CBC  Recent Labs  04/18/15 1154  WBC 6.1  HGB 12.3  HCT 38.1  MCV 86.4  PLT 315   Basic Metabolic Panel  Recent Labs  04/18/15 1154  NA 140  K 4.6  CL 102  CO2 28  GLUCOSE 88  BUN 11  CREATININE 0.76  CALCIUM 9.6   Liver Function Tests  Recent Labs  04/18/15 1154  AST 17  ALT 12*  ALKPHOS 78  BILITOT 0.5  PROT 7.4  ALBUMIN 3.7   Cardiac Enzymes  Recent Labs  04/18/15 1952 04/19/15 0126  TROPONINI 0.11* 0.10*     ASSESSMENT AND PLAN Principal Problem:   NSTEMI (non-ST elevated myocardial infarction) Active Problems:   Hypertension   Hypertensive urgency   Bilateral carotid bruits   Hyperlipidemia   1. NSTEMI/unstable angina with hypertensive emergency --She presented with hypertensive emergency and NSTEMI yesterday. She was discharged 4/29 stable, declined cath, with plan to f/u as outpatient. Nitro did helped to relieved chest pain over weekend. She presented to ED after 3 X SL nitro did not helped to relieved her pain.Trop is 0.11-->0.10, peaked at 0.82 last admission. She was given  nitro gtt, heparin and ASA  in ED. -Cath today. Lytes normal. Cr 0.76. Hgb 12.3. -- BP improved on home regimen overnight. Continue ASA , Norvasc  qd, Coreg 6.25mg  BID, lisinopril  qd, and Heparin.    2. Hypertension - improved. Continue home meds.   3. HL -04/15/2015: Cholesterol, Total 230*; HDL-C 41; LDL (calc) 172*; Triglycerides 87; VLDL 17. She refused statin on last admission. I again address importance of statin therapy. However, she did not want to be on therapy and wants to try lifestyle modification.  4. Bilateral carotid bruit -R>L bruit. Pending carotid doppler.  Signed, Bhagat,Bhavinkumar PA-C Pager [phone]  History and all data above reviewed.  Patient examined.  I agree with the findings as above.  She has not had further chest pain.  The patient exam reveals COR:RRR  ,  Lungs: Clear  ,  Abd: Positive bowel sounds, no rebound no guarding, Ext No edema  .  All available labs, radiology testing, previous records reviewed. Agree with documented assessment and plan. Unstable angina:  Plan cardiac cath.  The patient understands that risks included but are not limited to stroke (1 in 1000), death (1 in 1000), kidney failure [usually temporary] (1 in 500), bleeding (1 in 200), allergic reaction [possibly serious] (1 in 200).  The patient understands and agrees to proceed.   Rollene RotundaJames  Ayala Ribble  9:48 AM  04/19/2015

## 2015-04-19 NOTE — Progress Notes (Signed)
ANTICOAGULATION CONSULT NOTE  Pharmacy Consult for Heparin  Indication: chest pain/ACS  Allergies  Allergen Reactions  . Codeine     Makes me crazy    Patient Measurements: Height: 5\' 4"  (162.6 cm) Weight: 129 lb 3.2 oz (58.605 kg) IBW/kg (Calculated) : 54.7  Vital Signs: Temp: 97.9 F (36.6 C) (05/03 0024) Temp Source: Oral (05/03 0024) BP: 189/70 mmHg (05/02 2028) Pulse Rate: 70 (05/02 1945)  Labs:  Recent Labs  04/18/15 1154 04/18/15 1952 04/18/15 2355  HGB 12.3  --   --   HCT 38.1  --   --   PLT 315  --   --   HEPARINUNFRC  --   --  0.17*  CREATININE 0.76  --   --   TROPONINI  --  0.11*  --     Estimated Creatinine Clearance: 54.9 mL/min (by C-G formula based on Cr of 0.76).  Assessment: 74 y.o. female with chest pain for heparin   Goal of Therapy:  Heparin level 0.3-0.7 units/ml Monitor platelets by anticoagulation protocol: Yes   Plan:  Heparin 2000 units IV bolus, then increase heparin 850 units/hr Follow-up am labs.   Geannie RisenGreg Ryheem Jay, PharmD, BCPS

## 2015-04-19 NOTE — Consult Note (Signed)
[address]       [place] [id number]             [phone]        Magda KielCarol Potvin [facility] Medical Record #[id number] Date of Birth: 02-Feb-1941  Referring: [place] [facility]( [facility]) Primary Care: No primary care provider on file.  Chief Complaint:    Chief Complaint  Patient presents with  . Chest Pain    History of Present Illness:      Alexis Moss is a 74 yo white female with no previous history of CAD but recent diagnosis of HTN.  She was [facility] after presenting with complaints of fatigue, diaphoresis and "feeling odd."  This developed during training for a new job at the local [employer].  During that admission she admitted to several day headache with visual disturbances.  She had also not eaten much on the days leading to admission because rather than take medication for hypertension she wished to try diet modification.  In the ED her EKG showed SR with a RBBB.  Cardiac enzymes were minimally elevated.  She was evaluated by Dr. Anne FuSkains who recommended Cardiac catheterization, however the patient declined and wished to follow up on an outpatient basis and she was discharged home.  However patient developed chest pain on 04/17/2015.  Initially NTG provided relief, however her pain continued through 04/18/2015 and was unrelieved with NTG.  Therefore she presented to the ED for evaluation.  Her cardiac markers were minimally elevated, but were decreasing from previous ED visit.  She was placed on Heparin and Nitroglycerin and admitted for further care.  The patient was agreeable to proceed with catheterization.  This was done on 04/19/2015 which showed 3 vessel CAD with LM involvement.  It was felt Coronary Artery Bypass Graft would be indicated and TCTS consult was requested.  Currently the patient is chest pain free.  For the most part she is able to do whatever she wants.  However, she states that she can not walk very far  because her legs become very heavy.  She also states that her extremities swell if she has been sitting too long.  She denies ever smoking.  In regards to medication use she states that she prefers to not take any medications.  She does ask why stents were not able to be placed.    Current Activity/ Functional Status: Patient is independent with mobility/ambulation, transfers, ADL's, IADL's.   Zubrod Score: At the time of surgery this patient's most appropriate activity status/level should be described as: [x]     0    Normal activity, no symptoms []     1    Restricted in physical strenuous activity but ambulatory, able to do out light work []     2    Ambulatory and capable of self care, unable to do work activities, up and about                 more than 50%  Of the time                            []     3    Only limited self care, in bed greater than 50% of waking hours []     4    Completely disabled, no self care, confined to bed or chair []     5  Moribund  Past Medical History  Diagnosis Date  . Hypertension 03/2015    Past Surgical History  Procedure Laterality Date  . Tonsillectomy      History  Smoking status  . Never Smoker   Smokeless tobacco  . Not on file    History  Alcohol Use No    History   Social History  . Marital Status: Married    Spouse Name: N/A  . Number of Children: N/A  . Years of Education: N/A   Occupational History  . Not on file.   Social History Main Topics  . Smoking status: Never Smoker   . Smokeless tobacco: Not on file  . Alcohol Use: No  . Drug Use: No  . Sexual Activity: Not on file   Other Topics Concern  . Not on file   Social History Narrative    Allergies  Allergen Reactions  . Codeine     Makes me crazy    Current Facility-Administered Medications  Medication Dose Route Frequency Provider Last Rate Last Dose  . 0.9 %  sodium chloride infusion  250 mL Intravenous PRN Peter M [place], MD      . 0.9% sodium  chloride infusion  3 mL/kg/hr Intravenous Continuous Peter M [place], MD 175.8 mL/hr at 04/19/15 1453 3 mL/kg/hr at 04/19/15 1453  . acetaminophen (TYLENOL) tablet 650 mg  650 mg Oral Q4H PRN Bhavinkumar Bhagat, PA   650 mg at 04/19/15 1129  . amLODipine (NORVASC) tablet 5 mg  5 mg Oral Daily Pricilla Riffle, MD   5 mg at 04/19/15 0853  . aspirin EC tablet 81 mg  81 mg Oral Daily Bhavinkumar Bhagat, PA   81 mg at 04/19/15 0853  . carvedilol (COREG) tablet 6.25 mg  6.25 mg Oral BID WC Pricilla Riffle, MD   6.25 mg at 04/19/15 7425  . cholecalciferol (VITAMIN D) tablet 1,000 Units  1,000 Units Oral Daily Pricilla Riffle, MD   1,000 Units at 04/19/15 [phone]  . [START ON 04/20/2015] heparin ADULT infusion 100 units/mL (25000 units/250 mL)  800 Units/hr Intravenous Continuous Scarlett Presto, RPH      . hydrALAZINE (APRESOLINE) injection 10 mg  10 mg Intravenous Q6H PRN Bhavinkumar Bhagat, PA      . lisinopril (PRINIVIL,ZESTRIL) tablet 10 mg  10 mg Oral Daily Pricilla Riffle, MD   10 mg at 04/19/15 0853  . multivitamin with minerals tablet 1 tablet  1 tablet Oral Daily Pricilla Riffle, MD   1 tablet at 04/19/15 [phone]  . nitroGLYCERIN (NITROSTAT) SL tablet 0.4 mg  0.4 mg Sublingual Q5 Min x 3 PRN Bhavinkumar Bhagat, PA      . ondansetron (ZOFRAN) injection 4 mg  4 mg Intravenous Q6H PRN Bhavinkumar Bhagat, PA      . sodium chloride 0.9 % injection 3 mL  3 mL Intravenous Q12H Peter M [place], MD      . sodium chloride 0.9 % injection 3 mL  3 mL Intravenous PRN Peter M [place], MD      . vitamin C (ASCORBIC ACID) tablet 1,000 mg  1,000 mg Oral Daily Pricilla Riffle, MD   1,000 mg at 04/19/15 4332    Prescriptions prior to admission  Medication Sig Dispense Refill Last Dose  . amLODipine (NORVASC) 5 MG tablet Take 1 tablet (5 mg total) by mouth daily. 30 tablet 11 04/17/2015 at Unknown time  . Ascorbic Acid (VITAMIN C) 1000 MG tablet Take 1,000 mg by mouth  daily.   04/17/2015 at Unknown time  . aspirin EC 81 MG EC tablet Take 1 tablet  (81 mg total) by mouth daily.   04/18/2015 at Unknown time  . carvedilol (COREG) 6.25 MG tablet Take 1 tablet (6.25 mg total) by mouth 2 (two) times daily with a meal. 60 tablet 11 04/18/2015 at Unknown time  . Cholecalciferol (VITAMIN D PO) Take 1 tablet by mouth daily.   04/17/2015 at Unknown time  . Coconut Oil OIL Apply 1 application topically daily.   04/18/2015 at Unknown time  . lisinopril (PRINIVIL,ZESTRIL) 10 MG tablet Take 1 tablet (10 mg total) by mouth daily. 30 tablet 11 04/17/2015 at Unknown time  . MELATONIN PO Take 1 tablet by mouth daily as needed. For sleep per patient   few wks ag at Unknown time  . Multiple Vitamin (MULTIVITAMIN WITH MINERALS) TABS tablet Take 1 tablet by mouth daily.   04/17/2015 at Unknown time  . nitroGLYCERIN (NITROSTAT) 0.4 MG SL tablet Place 1 tablet (0.4 mg total) under the tongue every 5 (five) minutes x 3 doses as needed for chest pain. 25 tablet 12 04/18/2015 at Unknown time  . Potassium 99 MG TABS Take 1 tablet by mouth daily.   04/18/2015 at Unknown time    Family History  Problem Relation Age of Onset  . Protein S deficiency      "family"  . Deep vein thrombosis      "family"     Review of Systems:  Constitutional: negative for chills and fevers Eyes: negative, c/o throbbing behind right eye Respiratory: negative Cardiovascular: positive for chest pain and claudication, negative for dyspnea, orthopnea and paroxysmal nocturnal dyspnea Genitourinary:negative, no history of clot but anxious about Fam Hx of protein S deficiency Hematologic/lymphatic: negative, see above, entry error in GU box instead of Heme Neurological: negative     Cardiac Review of Systems: Y or N  Chest Pain [  y  ]  Resting SOB [ n  ] Exertional SOB  [n  ]  Orthopnea [  ]   Pedal Edema [   ]    Palpitations [n  ] Syncope  [  ]   Presyncope [   ]  General Review of Systems: [Y] = yes [  ]=no Constitional: recent weight change [  ]; anorexia [  ]; fatigue [ y ]; nausea [  ]; night  sweats [  ]; fever [  ]; or chills [  ]                                                               Dental: poor dentition[  ]; Last Dentist visit:   Eye : blurred vision [  ]; diplopia [   ]; vision changes Cove.Etienne  ];  Amaurosis fugax[  ]; Resp: cough [  ];  wheezing[  ];  hemoptysis[  ]; shortness of breath[  ]; paroxysmal nocturnal dyspnea[  ]; dyspnea on exertion[  ]; or orthopnea[  ];  GI:  gallstones[  ], vomiting[  ];  dysphagia[  ]; melena[  ];  hematochezia [  ]; heartburn[  ];   Hx of  Colonoscopy[  ]; GU: kidney stones [  ]; hematuria[  ];   dysuria [  ];  nocturia[  ];  history of     obstruction [  ]; urinary frequency [  ]             Skin: rash, swelling[  ];, hair loss[  ];  peripheral edema[  ];  or itching[  ]; Musculosketetal: myalgias[  ];  joint swelling[  ];  joint erythema[  ];  joint pain[  ];  back pain[  ];  Heme/Lymph: bruising[  ];  bleeding[  ];  anemia[  ];  Neuro: TIA[  ];  headaches[y  ];  stroke[  ];  vertigo[  ];  seizures[  ];   paresthesias[  ];  difficulty walking[ y ];  Psych:depression[  ]; anxiety[  ];  Endocrine: diabetes[ n ];  thyroid dysfunction[  ];  Immunizations: Flu [  ]; Pneumococcal[  ];  Other:  Physical Exam: BP 168/57 mmHg  Pulse 61  Temp(Src) 98.2 F (36.8 C) (Oral)  Resp 10  Ht  (1.626 m)  Wt 129 lb 3.2 oz (58.605 kg)  BMI 22.17 kg/m2  SpO2 100%   General appearance: alert, cooperative and no distress Head: Normocephalic, without obvious abnormality, atraumatic Neck: no adenopathy, no JVD, supple, symmetrical, trachea midline, thyroid not enlarged, symmetric, no tenderness/mass/nodules and Bilateral Carotid Bruit Resp: clear to auscultation bilaterally Cardio: regular rate and rhythm GI: soft, non-tender; bowel sounds normal; no masses,  no organomegaly Extremities: extremities normal, atraumatic, no cyanosis or edema Neurologic: Grossly normal  Diagnostic Studies & Laboratory data:     Recent Radiology Findings:   Dg  Chest 2 View  04/18/2015   CLINICAL DATA:  Left side chest pain for 3 days  EXAM: CHEST  2 VIEW  COMPARISON:  None.  FINDINGS: Cardiomediastinal silhouette is unremarkable. No acute infiltrate or pleural effusion. No pulmonary edema. Mild thoracic spine osteopenia. Mild degenerative changes mid thoracic spine.  IMPRESSION: No active cardiopulmonary disease.   Electronically Signed   By: Natasha Mead M.D.   On: 04/18/2015 12:37     I have independently reviewed the above radiologic studies.  Recent Lab Findings: Lab Results  Component Value Date   WBC 6.4 04/19/2015   HGB 12.3 04/19/2015   HCT 37.8 04/19/2015   PLT 285 04/19/2015   GLUCOSE 84 04/19/2015   CHOL 230* 04/15/2015   TRIG 87 04/15/2015   HDL 41 04/15/2015   LDLCALC 172* 04/15/2015   ALT 12* 04/18/2015   AST 17 04/18/2015   NA 140 04/19/2015   K 3.6 04/19/2015   CL 101 04/19/2015   CREATININE 0.62 04/19/2015   BUN 9 04/19/2015   CO2 26 04/19/2015   TSH 2.257 04/15/2015   INR 1.09 04/19/2015   HGBA1C 5.9* 04/15/2015      Assessment / Plan:      1. CAD- requesting CABG consult 2. HTN- hypertensive on exam- on Coreg, Lisinopril, Hydralazine, Norvasc 3. Carotid Bruit- will need Carotid Dopplers    I  spent 30 minutes counseling the patient face to face and 50% or more the  time was spent in counseling and coordination of care. The total time spent in the appointment was 60 minutes.    @ 04/19/2015 3:50 PM  Patient seen and examined. History taken, physical exam performed. Cines reviewed.   74 yo woman with previously untreated hypertension and hyperlipidemia presents with second NSTEMI in a week. At catheterization she has left main disease. CABG is indicated for survival benefit and relief of symptoms.  I discussed the general nature of the procedure, the need  for general anesthesia, the use of cardiopulmonary bypass, and the incisions to be used with Mrs Alexis Moss and her husband. We discussed the expected  hospital stay, overall recovery and short and long term outcomes. I reviewed the indications, risks, benefits and alternatives(medical therapy). They understand the risks include but are not limited to death, stroke, MI, DVT/PE, bleeding, possible need for transfusion, infections, arrhythmias, and other organ system dysfunction including respiratory, renal, or GI complications.\  She is concerned about family history of protein S deficiency- will check labs. In meantime will plan to use pharmacologic prophylaxis postop.  She accepts the risks and agrees to proceed.  Carotid duplex showed extensive plaque but no hemodynamically significant stenosis.  Salvatore DecentSteven C. Dorris FetchHendrickson, MD [facility] [phone]

## 2015-04-19 NOTE — Progress Notes (Signed)
VASCULAR LAB PRELIMINARY  PRELIMINARY  PRELIMINARY  PRELIMINARY  Pre-op Cardiac Surgery  Carotid Findings:  Bilateral- 40% to 59% upper end of range . Vertebral artery flow is anegrade.  Upper Extremity Right Left  Brachial Pressures Not obtained due to radial band from cardiac cath. Triphasic 156 Triphasic  Radial Waveforms Triphasic Triphasic  Ulnar Waveforms Triphasic Triphasic  Palmar Arch (Allen's Test) Normal Normal   Findings:  Doppler waveforms remained normal bilaterally with both radial and ulnar compressions    Lower  Extremity Right Left  Dorsalis Pedis 76 Dampened monophasic 84 Dampened monophasic  Posterior Tibial 59 Dampened monophasic 88 Dampened monophasic   ABIs - Right 0.49 Left - 0.56  Findings:  ABI on the right indicates a severe reduction in arterial flow. ABI on the left indicates a moderate to severe reduction in arterial flow. Doppler waveforms were abnormal bilaterally.   Lucero Auzenne, RVS 04/19/2015, 7:43 PM

## 2015-04-19 NOTE — Interval H&P Note (Signed)
History and Physical Interval Note:  04/19/2015 1:28 PM  Magda KielCarol Gorter  has presented today for surgery, with the diagnosis of unstable angina  The various methods of treatment have been discussed with the patient and family. After consideration of risks, benefits and other options for treatment, the patient has consented to  Procedure(s): Left Heart Cath and Coronary Angiography (N/A) as a surgical intervention .  The patient's history has been reviewed, patient examined, no change in status, stable for surgery.  I have reviewed the patient's chart and labs.  Questions were answered to the patient's satisfaction.    Cath Lab Visit (complete for each Cath Lab visit)  Clinical Evaluation Leading to the Procedure:   ACS: Yes.    Non-ACS:    Anginal Classification: CCS III  Anti-ischemic medical therapy: Maximal Therapy (2 or more classes of medications)  Non-Invasive Test Results: No non-invasive testing performed  Prior CABG: No previous CABG       Theron Aristaeter [facility] LouisianaJordanMD,FACC 04/19/2015 1:29 PM

## 2015-04-19 NOTE — Progress Notes (Signed)
 Patient Name: Alexis Moss Date of Encounter: 04/19/2015   SUBJECTIVE  Denies chest pain, SOB, nausea or headache.  CURRENT MEDS . amLODipine  5 mg Oral Daily  . amLODipine  5 mg Oral Daily  . aspirin EC  81 mg Oral Daily  . carvedilol  6.25 mg Oral BID WC  . cholecalciferol  1,000 Units Oral Daily  . lisinopril  10 mg Oral Daily  . multivitamin with minerals  1 tablet Oral Daily  . sodium chloride  3 mL Intravenous Q12H  . vitamin C  1,000 mg Oral Daily    OBJECTIVE  Filed Vitals:   04/19/15 0003 04/19/15 0024 04/19/15 0403 04/19/15 0514  BP: 161/59  134/51   Pulse: 66  65   Temp:  97.9 F (36.6 C)  97.6 F (36.4 C)  TempSrc:  Oral  Oral  Resp: 10  15   Height:      Weight:      SpO2: 98%  94%     Intake/Output Summary (Last 24 hours) at 04/19/15 0816 Last data filed at 04/19/15 0810  Gross per 24 hour  Intake      0 ml  Output   1125 ml  Net  -1125 ml   Filed Weights   04/18/15 1141 04/18/15 2029  Weight: 129 lb (58.514 kg) 129 lb 3.2 oz (58.605 kg)    PHYSICAL EXAM  General: Pleasant, NAD. Neuro: Alert and oriented X 3. Moves all extremities spontaneously. Psych: Normal affect. HEENT:  Normal  Neck: Supple without JVD. bilateral carotid bruits, R>L.. Lungs:  Resp regular and unlabored, CTA. Heart: RRR no s3, s4, or murmurs. Abdomen: Soft, non-tender, non-distended, BS + x 4.  Extremities: No clubbing, cyanosis or edema. DP/PT/Radials 2+ and equal bilaterally.  Accessory Clinical Findings  CBC  Recent Labs  04/18/15 1154  WBC 6.1  HGB 12.3  HCT 38.1  MCV 86.4  PLT 315   Basic Metabolic Panel  Recent Labs  04/18/15 1154  NA 140  K 4.6  CL 102  CO2 28  GLUCOSE 88  BUN 11  CREATININE 0.76  CALCIUM 9.6   Liver Function Tests  Recent Labs  04/18/15 1154  AST 17  ALT 12*  ALKPHOS 78  BILITOT 0.5  PROT 7.4  ALBUMIN 3.7   Cardiac Enzymes  Recent Labs  04/18/15 1952 04/19/15 0126  TROPONINI 0.11* 0.10*     ASSESSMENT AND PLAN Principal Problem:   NSTEMI (non-ST elevated myocardial infarction) Active Problems:   Hypertension   Hypertensive urgency   Bilateral carotid bruits   Hyperlipidemia   1. NSTEMI/unstable angina with hypertensive emergency --She presented with hypertensive emergency and NSTEMI yesterday. She was discharged 4/29 stable, declined cath, with plan to f/u as outpatient. Nitro did helped to relieved chest pain over weekend. She presented to ED after 3 X SL nitro did not helped to relieved her pain.Trop is 0.11-->0.10, peaked at 0.82 last admission. She was given  nitro gtt, heparin and ASA 324mg in ED. -Cath today. Lytes normal. Cr 0.76. Hgb 12.3. -- BP improved on home regimen overnight. Continue ASA 81mg, Norvasc 5mg qd, Coreg 6.25mg BID, lisinopril 10mg qd, and Heparin.    2. Hypertension - improved. Continue home meds.   3. HL -04/15/2015: Cholesterol, Total 230*; HDL-C 41; LDL (calc) 172*; Triglycerides 87; VLDL 17. She refused statin on last admission. I again address importance of statin therapy. However, she did not want to be on therapy and wants to try lifestyle modification.     4. Bilateral carotid bruit -R>L bruit. Pending carotid doppler.  Signed, Bhagat,Bhavinkumar PA-C Pager [phone]  History and all data above reviewed.  Patient examined.  I agree with the findings as above.  She has not had further chest pain.  The patient exam reveals COR:RRR  ,  Lungs: Clear  ,  Abd: Positive bowel sounds, no rebound no guarding, Ext No edema  .  All available labs, radiology testing, previous records reviewed. Agree with documented assessment and plan. Unstable angina:  Plan cardiac cath.  The patient understands that risks included but are not limited to stroke (1 in 1000), death (1 in 1000), kidney failure [usually temporary] (1 in 500), bleeding (1 in 200), allergic reaction [possibly serious] (1 in 200).  The patient understands and agrees to proceed.   Almus Woodham  9:48 AM  04/19/2015  

## 2015-04-19 NOTE — Progress Notes (Signed)
UR completed 

## 2015-04-20 ENCOUNTER — Encounter (HOSPITAL_COMMUNITY): Payer: Self-pay | Admitting: Certified Registered Nurse Anesthetist

## 2015-04-20 ENCOUNTER — Inpatient Hospital Stay (HOSPITAL_COMMUNITY): Payer: Medicare Other

## 2015-04-20 ENCOUNTER — Inpatient Hospital Stay (HOSPITAL_COMMUNITY): Payer: Medicare Other | Admitting: Certified Registered Nurse Anesthetist

## 2015-04-20 ENCOUNTER — Encounter (HOSPITAL_COMMUNITY)
Admission: EM | Disposition: A | Discharge status: Skilled Nursing Facility | Payer: Medicare Other | Source: Home / Self Care | Attending: Thoracic Surgery (Cardiothoracic Vascular Surgery)

## 2015-04-20 DIAGNOSIS — Z951 Presence of aortocoronary bypass graft: Secondary | ICD-10-CM

## 2015-04-20 DIAGNOSIS — I25110 Atherosclerotic heart disease of native coronary artery with unstable angina pectoris: Secondary | ICD-10-CM

## 2015-04-20 HISTORY — PX: VEIN HARVEST: SHX6363

## 2015-04-20 HISTORY — PX: CORONARY ARTERY BYPASS GRAFT: SHX141

## 2015-04-20 HISTORY — PX: TEE WITHOUT CARDIOVERSION: SHX5443

## 2015-04-20 HISTORY — DX: Presence of aortocoronary bypass graft: Z95.1

## 2015-04-20 LAB — POCT I-STAT 3, ART BLOOD GAS (G3+)
Acid-Base Excess: 1 mmol/L (ref 0.0–2.0)
Acid-base deficit: 2 mmol/L (ref 0.0–2.0)
BICARBONATE: 25.9 meq/L — AB (ref 20.0–24.0)
BICARBONATE: 22.1 meq/L (ref 20.0–24.0)
O2 SAT: 98 %
O2 Saturation: 100 %
PCO2 ART: 32.7 mmHg — AB (ref 35.0–45.0)
PO2 ART: 90 mmHg (ref 80.0–100.0)
Patient temperature: 35.7
TCO2: 27 mmol/L (ref 0–100)
TCO2: 23 mmol/L (ref 0–100)
pCO2 arterial: 39.4 mmHg (ref 35.0–45.0)
pH, Arterial: 7.427 (ref 7.350–7.450)
pH, Arterial: 7.433 (ref 7.350–7.450)
pO2, Arterial: 322 mmHg — ABNORMAL HIGH (ref 80.0–100.0)

## 2015-04-20 LAB — POCT I-STAT, CHEM 8
BUN: 9 mg/dL (ref 6–20)
BUN: 8 mg/dL (ref 6–20)
BUN: 7 mg/dL (ref 6–20)
BUN: 7 mg/dL (ref 6–20)
BUN: 6 mg/dL (ref 6–20)
BUN: 6 mg/dL (ref 6–20)
CALCIUM ION: 1.24 mmol/L (ref 1.13–1.30)
CALCIUM ION: 1.22 mmol/L (ref 1.13–1.30)
CALCIUM ION: 1 mmol/L — AB (ref 1.13–1.30)
CALCIUM ION: 1.05 mmol/L — AB (ref 1.13–1.30)
CHLORIDE: 100 mmol/L — AB (ref 101–111)
CHLORIDE: 105 mmol/L (ref 101–111)
CREATININE: 0.3 mg/dL — AB (ref 0.44–1.00)
CREATININE: 0.5 mg/dL (ref 0.44–1.00)
CREATININE: 0.5 mg/dL (ref 0.44–1.00)
Calcium, Ion: 1.06 mmol/L — ABNORMAL LOW (ref 1.13–1.30)
Calcium, Ion: 1.13 mmol/L (ref 1.13–1.30)
Chloride: 101 mmol/L (ref 101–111)
Chloride: 98 mmol/L — ABNORMAL LOW (ref 101–111)
Chloride: 98 mmol/L — ABNORMAL LOW (ref 101–111)
Chloride: 99 mmol/L — ABNORMAL LOW (ref 101–111)
Creatinine, Ser: 0.6 mg/dL (ref 0.44–1.00)
Creatinine, Ser: 0.6 mg/dL (ref 0.44–1.00)
Creatinine, Ser: 0.4 mg/dL — ABNORMAL LOW (ref 0.44–1.00)
GLUCOSE: 84 mg/dL (ref 70–99)
GLUCOSE: 143 mg/dL — AB (ref 70–99)
Glucose, Bld: 94 mg/dL (ref 70–99)
Glucose, Bld: 100 mg/dL — ABNORMAL HIGH (ref 70–99)
Glucose, Bld: 93 mg/dL (ref 70–99)
Glucose, Bld: 138 mg/dL — ABNORMAL HIGH (ref 70–99)
HCT: 27 % — ABNORMAL LOW (ref 36.0–46.0)
HCT: 23 % — ABNORMAL LOW (ref 36.0–46.0)
HCT: 20 % — ABNORMAL LOW (ref 36.0–46.0)
HEMATOCRIT: 34 % — AB (ref 36.0–46.0)
HEMATOCRIT: 21 % — AB (ref 36.0–46.0)
HEMATOCRIT: 25 % — AB (ref 36.0–46.0)
HEMOGLOBIN: 11.6 g/dL — AB (ref 12.0–15.0)
HEMOGLOBIN: 9.2 g/dL — AB (ref 12.0–15.0)
HEMOGLOBIN: 7.8 g/dL — AB (ref 12.0–15.0)
HEMOGLOBIN: 6.8 g/dL — AB (ref 12.0–15.0)
HEMOGLOBIN: 8.5 g/dL — AB (ref 12.0–15.0)
Hemoglobin: 7.1 g/dL — ABNORMAL LOW (ref 12.0–15.0)
POTASSIUM: 4.2 mmol/L (ref 3.5–5.1)
POTASSIUM: 3.7 mmol/L (ref 3.5–5.1)
POTASSIUM: 3.8 mmol/L (ref 3.5–5.1)
Potassium: 3.2 mmol/L — ABNORMAL LOW (ref 3.5–5.1)
Potassium: 3.1 mmol/L — ABNORMAL LOW (ref 3.5–5.1)
Potassium: 3.1 mmol/L — ABNORMAL LOW (ref 3.5–5.1)
SODIUM: 140 mmol/L (ref 135–145)
SODIUM: 136 mmol/L (ref 135–145)
Sodium: 139 mmol/L (ref 135–145)
Sodium: 139 mmol/L (ref 135–145)
Sodium: 135 mmol/L (ref 135–145)
Sodium: 137 mmol/L (ref 135–145)
TCO2: 24 mmol/L (ref 0–100)
TCO2: 23 mmol/L (ref 0–100)
TCO2: 24 mmol/L (ref 0–100)
TCO2: 24 mmol/L (ref 0–100)
TCO2: 23 mmol/L (ref 0–100)
TCO2: 18 mmol/L (ref 0–100)

## 2015-04-20 LAB — MAGNESIUM: Magnesium: 3.3 mg/dL — ABNORMAL HIGH (ref 1.7–2.4)

## 2015-04-20 LAB — CBC
HCT: 26 % — ABNORMAL LOW (ref 36.0–46.0)
HEMATOCRIT: 36.7 % (ref 36.0–46.0)
HEMATOCRIT: 25.5 % — AB (ref 36.0–46.0)
HEMOGLOBIN: 11.7 g/dL — AB (ref 12.0–15.0)
HEMOGLOBIN: 8.5 g/dL — AB (ref 12.0–15.0)
Hemoglobin: 8.6 g/dL — ABNORMAL LOW (ref 12.0–15.0)
MCH: 27.1 pg (ref 26.0–34.0)
MCH: 27.5 pg (ref 26.0–34.0)
MCH: 27.7 pg (ref 26.0–34.0)
MCHC: 31.9 g/dL (ref 30.0–36.0)
MCHC: 33.1 g/dL (ref 30.0–36.0)
MCHC: 33.3 g/dL (ref 30.0–36.0)
MCV: 85.2 fL (ref 78.0–100.0)
MCV: 83.1 fL (ref 78.0–100.0)
MCV: 83.1 fL (ref 78.0–100.0)
PLATELETS: 284 10*3/uL (ref 150–400)
PLATELETS: 174 10*3/uL (ref 150–400)
PLATELETS: 172 10*3/uL (ref 150–400)
RBC: 4.31 MIL/uL (ref 3.87–5.11)
RBC: 3.13 MIL/uL — AB (ref 3.87–5.11)
RBC: 3.07 MIL/uL — ABNORMAL LOW (ref 3.87–5.11)
RDW: 13.3 % (ref 11.5–15.5)
RDW: 13.3 % (ref 11.5–15.5)
RDW: 13.1 % (ref 11.5–15.5)
WBC: 6.7 10*3/uL (ref 4.0–10.5)
WBC: 6.9 10*3/uL (ref 4.0–10.5)
WBC: 8.1 10*3/uL (ref 4.0–10.5)

## 2015-04-20 LAB — GLUCOSE, CAPILLARY
Glucose-Capillary: 111 mg/dL — ABNORMAL HIGH (ref 70–99)
Glucose-Capillary: 113 mg/dL — ABNORMAL HIGH (ref 70–99)
Glucose-Capillary: 138 mg/dL — ABNORMAL HIGH (ref 70–99)

## 2015-04-20 LAB — PROTIME-INR
INR: 1.48 (ref 0.00–1.49)
Prothrombin Time: 18.1 seconds — ABNORMAL HIGH (ref 11.6–15.2)

## 2015-04-20 LAB — CREATININE, SERUM: Creatinine, Ser: 0.68 mg/dL (ref 0.44–1.00)

## 2015-04-20 LAB — PLATELET COUNT: Platelets: 190 10*3/uL (ref 150–400)

## 2015-04-20 LAB — PROTEIN S ACTIVITY: PROTEIN S ACTIVITY: 112 % (ref 60–145)

## 2015-04-20 LAB — POCT I-STAT 4, (NA,K, GLUC, HGB,HCT)
GLUCOSE: 125 mg/dL — AB (ref 70–99)
HEMATOCRIT: 26 % — AB (ref 36.0–46.0)
HEMOGLOBIN: 8.8 g/dL — AB (ref 12.0–15.0)
POTASSIUM: 2.9 mmol/L — AB (ref 3.5–5.1)
SODIUM: 139 mmol/L (ref 135–145)

## 2015-04-20 LAB — PROTEIN S, TOTAL: PROTEIN S AG TOTAL: 151 % — AB (ref 58–150)

## 2015-04-20 LAB — APTT: APTT: 38 s — AB (ref 24–37)

## 2015-04-20 LAB — PREPARE RBC (CROSSMATCH)

## 2015-04-20 LAB — HEMOGLOBIN AND HEMATOCRIT, BLOOD
HEMATOCRIT: 22.1 % — AB (ref 36.0–46.0)
HEMOGLOBIN: 7.3 g/dL — AB (ref 12.0–15.0)

## 2015-04-20 SURGERY — CORONARY ARTERY BYPASS GRAFTING (CABG)
Anesthesia: General | Site: Leg Upper

## 2015-04-20 MED ORDER — CEFUROXIME SODIUM 1.5 G IJ SOLR
1.5000 g | Freq: Two times a day (BID) | INTRAMUSCULAR | Status: DC
  Administered 2015-04-20 – 2015-04-21 (×3): 1.5 g via INTRAVENOUS
  Filled 2015-04-20 (×4): qty 1.5

## 2015-04-20 MED ORDER — ACETAMINOPHEN 160 MG/5ML PO SOLN: 650.0000 mg | Freq: Once | ORAL | Status: AC

## 2015-04-20 MED ORDER — ASPIRIN 81 MG PO CHEW: 324.0000 mg | CHEWABLE_TABLET | Freq: Every day | ORAL | Status: DC

## 2015-04-20 MED ORDER — BISACODYL 5 MG PO TBEC
10.0000 mg | DELAYED_RELEASE_TABLET | Freq: Every day | ORAL | Status: DC
  Administered 2015-04-21 – 2015-04-24 (×4): 10 mg via ORAL
  Filled 2015-04-20 (×5): qty 2

## 2015-04-20 MED ORDER — LACTATED RINGERS IV SOLN: 500.0000 mL | Freq: Once | INTRAVENOUS | Status: AC | PRN

## 2015-04-20 MED ORDER — INSULIN REGULAR BOLUS VIA INFUSION
0.0000 [IU] | Freq: Three times a day (TID) | INTRAVENOUS | Status: DC
Start: 2015-04-20 — End: 2015-04-20
  Filled 2015-04-20: qty 10

## 2015-04-20 MED ORDER — ACETAMINOPHEN 650 MG RE SUPP
650.0000 mg | Freq: Once | RECTAL | Status: AC
  Administered 2015-04-20: 650 mg via RECTAL

## 2015-04-20 MED ORDER — LACTATED RINGERS IV SOLN
INTRAVENOUS | Status: DC | PRN
Start: 2015-04-20 — End: 2015-04-20
  Administered 2015-04-20 (×2): via INTRAVENOUS

## 2015-04-20 MED ORDER — OXYCODONE HCL 5 MG PO TABS: 5.0000 mg | ORAL_TABLET | ORAL | Status: DC | PRN

## 2015-04-20 MED ORDER — PHENYLEPHRINE HCL 10 MG/ML IJ SOLN
INTRAMUSCULAR | Status: DC | PRN
  Administered 2015-04-20 (×2): 80 ug via INTRAVENOUS

## 2015-04-20 MED ORDER — METOPROLOL TARTRATE 12.5 MG HALF TABLET
12.5000 mg | ORAL_TABLET | Freq: Two times a day (BID) | ORAL | Status: DC
  Administered 2015-04-21 (×2): 12.5 mg via ORAL
  Filled 2015-04-20 (×5): qty 1

## 2015-04-20 MED ORDER — LACTATED RINGERS IV SOLN: INTRAVENOUS | Status: DC

## 2015-04-20 MED ORDER — 0.9 % SODIUM CHLORIDE (POUR BTL) OPTIME
TOPICAL | Status: DC | PRN
  Administered 2015-04-20: 1000 mL
  Administered 2015-04-20: 5000 mL

## 2015-04-20 MED ORDER — TRAMADOL HCL 50 MG PO TABS
50.0000 mg | ORAL_TABLET | ORAL | Status: DC | PRN
  Administered 2015-04-21 – 2015-04-26 (×3): 50 mg via ORAL
  Filled 2015-04-20 (×3): qty 1

## 2015-04-20 MED ORDER — DEXMEDETOMIDINE HCL IN NACL 200 MCG/50ML IV SOLN
INTRAVENOUS | Status: DC | PRN
  Administered 2015-04-20: .3 ug/kg/h via INTRAVENOUS

## 2015-04-20 MED ORDER — MORPHINE SULFATE 2 MG/ML IJ SOLN
2.0000 mg | INTRAMUSCULAR | Status: DC | PRN
  Administered 2015-04-21 (×4): 2 mg via INTRAVENOUS
  Filled 2015-04-20 (×2): qty 1
  Filled 2015-04-20: qty 2
  Filled 2015-04-20 (×2): qty 1

## 2015-04-20 MED ORDER — CHLORHEXIDINE GLUCONATE CLOTH 2 % EX PADS
6.0000 | MEDICATED_PAD | Freq: Every day | CUTANEOUS | Status: DC
  Administered 2015-04-20 – 2015-04-21 (×2): 6 via TOPICAL

## 2015-04-20 MED ORDER — LACTATED RINGERS IV SOLN
INTRAVENOUS | Status: DC | PRN
  Administered 2015-04-20: 08:00:00 via INTRAVENOUS

## 2015-04-20 MED ORDER — POTASSIUM CHLORIDE 10 MEQ/50ML IV SOLN
10.0000 meq | Freq: Once | INTRAVENOUS | Status: AC
  Administered 2015-04-20: 10 meq via INTRAVENOUS

## 2015-04-20 MED ORDER — FENTANYL CITRATE (PF) 250 MCG/5ML IJ SOLN
INTRAMUSCULAR | Status: AC
  Filled 2015-04-20: qty 5

## 2015-04-20 MED ORDER — HEPARIN SODIUM (PORCINE) 1000 UNIT/ML IJ SOLN
INTRAMUSCULAR | Status: DC | PRN
  Administered 2015-04-20: 2000 [IU] via INTRAVENOUS
  Administered 2015-04-20: 16000 [IU] via INTRAVENOUS

## 2015-04-20 MED ORDER — ACETAMINOPHEN 160 MG/5ML PO SOLN
1000.0000 mg | Freq: Four times a day (QID) | ORAL | Status: DC
  Administered 2015-04-20: 1000 mg
  Filled 2015-04-20: qty 40.6

## 2015-04-20 MED ORDER — PROTAMINE SULFATE 10 MG/ML IV SOLN
INTRAVENOUS | Status: DC | PRN
  Administered 2015-04-20: 160 mg via INTRAVENOUS

## 2015-04-20 MED ORDER — SODIUM CHLORIDE 0.9 % IV SOLN
250.0000 [IU] | INTRAVENOUS | Status: DC | PRN
  Administered 2015-04-20: .7 [IU]/h via INTRAVENOUS

## 2015-04-20 MED ORDER — MICROFIBRILLAR COLL HEMOSTAT EX PADS: MEDICATED_PAD | CUTANEOUS | Status: DC | PRN

## 2015-04-20 MED ORDER — MAGNESIUM SULFATE 4 GM/100ML IV SOLN
4.0000 g | Freq: Once | INTRAVENOUS | Status: AC
  Administered 2015-04-20: 4 g via INTRAVENOUS
  Filled 2015-04-20: qty 100

## 2015-04-20 MED ORDER — PANTOPRAZOLE SODIUM 40 MG PO TBEC
40.0000 mg | DELAYED_RELEASE_TABLET | Freq: Every day | ORAL | Status: DC
Start: 2015-04-22 — End: 2015-04-26
  Administered 2015-04-22 – 2015-04-26 (×5): 40 mg via ORAL
  Filled 2015-04-20 (×5): qty 1

## 2015-04-20 MED ORDER — HYDRALAZINE HCL 20 MG/ML IJ SOLN
10.0000 mg | INTRAMUSCULAR | Status: DC | PRN
  Administered 2015-04-21 – 2015-04-25 (×2): 10 mg via INTRAVENOUS
  Filled 2015-04-20 (×2): qty 1

## 2015-04-20 MED ORDER — ACETAMINOPHEN 500 MG PO TABS
1000.0000 mg | ORAL_TABLET | Freq: Four times a day (QID) | ORAL | Status: AC
  Administered 2015-04-21 – 2015-04-25 (×14): 1000 mg via ORAL
  Filled 2015-04-20 (×20): qty 2

## 2015-04-20 MED ORDER — LABETALOL HCL 5 MG/ML IV SOLN
10.0000 mg | INTRAVENOUS | Status: DC | PRN
  Filled 2015-04-20: qty 4

## 2015-04-20 MED ORDER — PLASMA-LYTE 148 IV SOLN
INTRAVENOUS | Status: DC | PRN
  Administered 2015-04-20: 500 mL via INTRAVASCULAR

## 2015-04-20 MED ORDER — ONDANSETRON HCL 4 MG/2ML IJ SOLN
4.0000 mg | Freq: Four times a day (QID) | INTRAMUSCULAR | Status: DC | PRN
  Administered 2015-04-21 – 2015-04-22 (×3): 4 mg via INTRAVENOUS
  Filled 2015-04-20 (×4): qty 2

## 2015-04-20 MED ORDER — SODIUM CHLORIDE 0.9 % IJ SOLN
OROMUCOSAL | Status: DC | PRN
  Administered 2015-04-20: 3 mL via TOPICAL

## 2015-04-20 MED ORDER — INSULIN ASPART 100 UNIT/ML ~~LOC~~ SOLN
0.0000 [IU] | SUBCUTANEOUS | Status: DC
  Administered 2015-04-20 – 2015-04-21 (×3): 2 [IU] via SUBCUTANEOUS

## 2015-04-20 MED ORDER — MUPIROCIN 2 % EX OINT
1.0000 "application " | TOPICAL_OINTMENT | Freq: Two times a day (BID) | CUTANEOUS | Status: DC
  Administered 2015-04-20 – 2015-04-21 (×3): 1 via NASAL
  Filled 2015-04-20: qty 22

## 2015-04-20 MED ORDER — ASPIRIN EC 325 MG PO TBEC
325.0000 mg | DELAYED_RELEASE_TABLET | Freq: Every day | ORAL | Status: DC
  Administered 2015-04-21 – 2015-04-26 (×6): 325 mg via ORAL
  Filled 2015-04-20 (×7): qty 1

## 2015-04-20 MED ORDER — DOCUSATE SODIUM 100 MG PO CAPS
200.0000 mg | ORAL_CAPSULE | Freq: Every day | ORAL | Status: DC
  Administered 2015-04-21 – 2015-04-25 (×5): 200 mg via ORAL
  Filled 2015-04-20 (×7): qty 2

## 2015-04-20 MED ORDER — NITROGLYCERIN IN D5W 200-5 MCG/ML-% IV SOLN
INTRAVENOUS | Status: DC | PRN
Start: 2015-04-20 — End: 2015-04-20
  Administered 2015-04-20: 5 ug/min via INTRAVENOUS

## 2015-04-20 MED ORDER — ALBUMIN HUMAN 5 % IV SOLN
250.0000 mL | INTRAVENOUS | Status: AC | PRN
  Administered 2015-04-20 (×4): 250 mL via INTRAVENOUS
  Filled 2015-04-20 (×2): qty 250

## 2015-04-20 MED ORDER — METOPROLOL TARTRATE 25 MG/10 ML ORAL SUSPENSION
12.5000 mg | Freq: Two times a day (BID) | ORAL | Status: DC
  Administered 2015-04-20: 12.5 mg
  Filled 2015-04-20 (×5): qty 5

## 2015-04-20 MED ORDER — NITROGLYCERIN IN D5W 200-5 MCG/ML-% IV SOLN: INTRAVENOUS | Status: DC | PRN

## 2015-04-20 MED ORDER — SODIUM CHLORIDE 0.9 % IV SOLN
INTRAVENOUS | Status: DC
  Administered 2015-04-20: 16:00:00 via INTRAVENOUS

## 2015-04-20 MED ORDER — SODIUM CHLORIDE 0.9 % IV SOLN: 10.0000 g | INTRAVENOUS | Status: DC | PRN

## 2015-04-20 MED ORDER — PROPOFOL 10 MG/ML IV BOLUS
INTRAVENOUS | Status: DC | PRN
  Administered 2015-04-20 (×3): 30 mg via INTRAVENOUS
  Administered 2015-04-20: 20 mg via INTRAVENOUS

## 2015-04-20 MED ORDER — VANCOMYCIN HCL 1000 MG IV SOLR
1250.0000 mg | INTRAVENOUS | Status: DC | PRN
  Administered 2015-04-20: 1250 mg via INTRAVENOUS

## 2015-04-20 MED ORDER — FENTANYL CITRATE (PF) 100 MCG/2ML IJ SOLN
INTRAMUSCULAR | Status: DC | PRN
  Administered 2015-04-20: 100 ug via INTRAVENOUS
  Administered 2015-04-20 (×2): 150 ug via INTRAVENOUS
  Administered 2015-04-20: 100 ug via INTRAVENOUS
  Administered 2015-04-20 (×3): 250 ug via INTRAVENOUS

## 2015-04-20 MED ORDER — MORPHINE SULFATE 2 MG/ML IJ SOLN
1.0000 mg | INTRAMUSCULAR | Status: AC | PRN
  Administered 2015-04-20: 1 mg via INTRAVENOUS

## 2015-04-20 MED ORDER — MIDAZOLAM HCL 2 MG/2ML IJ SOLN
2.0000 mg | INTRAMUSCULAR | Status: DC | PRN
  Administered 2015-04-20: 2 mg via INTRAVENOUS
  Administered 2015-04-20: 1 mg via INTRAVENOUS
  Filled 2015-04-20: qty 2

## 2015-04-20 MED ORDER — HEMOSTATIC AGENTS (NO CHARGE) OPTIME: TOPICAL | Status: DC | PRN

## 2015-04-20 MED ORDER — PROPOFOL 10 MG/ML IV BOLUS
INTRAVENOUS | Status: AC
  Filled 2015-04-20: qty 20

## 2015-04-20 MED ORDER — ALBUMIN HUMAN 5 % IV SOLN
INTRAVENOUS | Status: DC | PRN
  Administered 2015-04-20: 14:00:00 via INTRAVENOUS

## 2015-04-20 MED ORDER — VANCOMYCIN HCL IN DEXTROSE 1-5 GM/200ML-% IV SOLN
1000.0000 mg | Freq: Once | INTRAVENOUS | Status: AC
  Administered 2015-04-20: 1000 mg via INTRAVENOUS
  Filled 2015-04-20: qty 200

## 2015-04-20 MED ORDER — DEXMEDETOMIDINE HCL IN NACL 200 MCG/50ML IV SOLN
0.0000 ug/kg/h | INTRAVENOUS | Status: DC
Start: 2015-04-20 — End: 2015-04-22

## 2015-04-20 MED ORDER — ROCURONIUM BROMIDE 100 MG/10ML IV SOLN
INTRAVENOUS | Status: DC | PRN
  Administered 2015-04-20 (×3): 50 mg via INTRAVENOUS

## 2015-04-20 MED ORDER — DEXTROSE 5 % IV SOLN
1.5000 g | INTRAVENOUS | Status: DC | PRN
  Administered 2015-04-20: 1.5 g via INTRAVENOUS
  Administered 2015-04-20: .75 g via INTRAVENOUS

## 2015-04-20 MED ORDER — MIDAZOLAM HCL 5 MG/5ML IJ SOLN
INTRAMUSCULAR | Status: DC | PRN
  Administered 2015-04-20: 1 mg via INTRAVENOUS
  Administered 2015-04-20: 3 mg via INTRAVENOUS
  Administered 2015-04-20 (×2): 2 mg via INTRAVENOUS

## 2015-04-20 MED ORDER — SODIUM CHLORIDE 0.9 % IJ SOLN
3.0000 mL | Freq: Two times a day (BID) | INTRAMUSCULAR | Status: DC
  Administered 2015-04-21 (×2): 3 mL via INTRAVENOUS

## 2015-04-20 MED ORDER — BISACODYL 10 MG RE SUPP: 10.0000 mg | Freq: Every day | RECTAL | Status: DC

## 2015-04-20 MED ORDER — MIDAZOLAM HCL 2 MG/2ML IJ SOLN
INTRAMUSCULAR | Status: AC
Start: 2015-04-20 — End: 2015-04-20
  Filled 2015-04-20: qty 2

## 2015-04-20 MED ORDER — PHENYLEPHRINE HCL 10 MG/ML IJ SOLN
10.0000 mg | INTRAVENOUS | Status: DC | PRN
  Administered 2015-04-20: 20 ug/min via INTRAVENOUS

## 2015-04-20 MED ORDER — SODIUM CHLORIDE 0.9 % IV SOLN
10.0000 g | INTRAVENOUS | Status: DC | PRN
  Administered 2015-04-20: 5 g/h via INTRAVENOUS

## 2015-04-20 MED ORDER — SODIUM CHLORIDE 0.9 % IV SOLN
250.0000 mL | INTRAVENOUS | Status: DC
  Administered 2015-04-22 (×2): 250 mL via INTRAVENOUS

## 2015-04-20 MED ORDER — FAMOTIDINE IN NACL 20-0.9 MG/50ML-% IV SOLN
20.0000 mg | Freq: Two times a day (BID) | INTRAVENOUS | Status: DC
  Administered 2015-04-20: 20 mg via INTRAVENOUS

## 2015-04-20 MED ORDER — MORPHINE SULFATE 4 MG/ML IJ SOLN
INTRAMUSCULAR | Status: AC
  Administered 2015-04-20: 2 mg
  Filled 2015-04-20: qty 1

## 2015-04-20 MED ORDER — DEXTROSE 5 % IV SOLN
0.0000 ug/min | INTRAVENOUS | Status: DC
  Filled 2015-04-20: qty 2

## 2015-04-20 MED ORDER — SODIUM CHLORIDE 0.9 % IJ SOLN: 3.0000 mL | INTRAMUSCULAR | Status: DC | PRN

## 2015-04-20 MED ORDER — POTASSIUM CHLORIDE 10 MEQ/50ML IV SOLN
10.0000 meq | INTRAVENOUS | Status: AC
Start: 2015-04-20 — End: 2015-04-20
  Administered 2015-04-20 (×3): 10 meq via INTRAVENOUS

## 2015-04-20 MED ORDER — MIDAZOLAM HCL 10 MG/2ML IJ SOLN
INTRAMUSCULAR | Status: AC
  Filled 2015-04-20: qty 2

## 2015-04-20 MED ORDER — SODIUM CHLORIDE 0.9 % IV SOLN
INTRAVENOUS | Status: DC
  Filled 2015-04-20: qty 2.5

## 2015-04-20 MED ORDER — ARTIFICIAL TEARS OP OINT
TOPICAL_OINTMENT | OPHTHALMIC | Status: DC | PRN
  Administered 2015-04-20: 1 via OPHTHALMIC

## 2015-04-20 MED ORDER — SODIUM CHLORIDE 0.45 % IV SOLN
INTRAVENOUS | Status: DC | PRN
  Administered 2015-04-20: 15:00:00 via INTRAVENOUS

## 2015-04-20 MED ORDER — METOPROLOL TARTRATE 1 MG/ML IV SOLN: 2.5000 mg | INTRAVENOUS | Status: DC | PRN

## 2015-04-20 MED ORDER — NITROGLYCERIN IN D5W 200-5 MCG/ML-% IV SOLN: 0.0000 ug/min | INTRAVENOUS | Status: DC

## 2015-04-20 MED FILL — Electrolyte-R (PH 7.4) Solution: INTRAVENOUS | Qty: 4000 | Status: AC

## 2015-04-20 MED FILL — Heparin Sodium (Porcine) Inj 1000 Unit/ML: INTRAMUSCULAR | Qty: 10 | Status: AC

## 2015-04-20 MED FILL — Mannitol IV Soln 20%: INTRAVENOUS | Qty: 500 | Status: AC

## 2015-04-20 MED FILL — Lidocaine HCl IV Inj 20 MG/ML: INTRAVENOUS | Qty: 5 | Status: AC

## 2015-04-20 MED FILL — Sodium Chloride IV Soln 0.9%: INTRAVENOUS | Qty: 3000 | Status: AC

## 2015-04-20 MED FILL — Sodium Bicarbonate IV Soln 8.4%: INTRAVENOUS | Qty: 50 | Status: AC

## 2015-04-20 SURGICAL SUPPLY — 83 items
BAG DECANTER FOR FLEXI CONT (MISCELLANEOUS) ×4 IMPLANT
BANDAGE ELASTIC 4 VELCRO ST LF (GAUZE/BANDAGES/DRESSINGS) ×4 IMPLANT
BANDAGE ELASTIC 6 VELCRO ST LF (GAUZE/BANDAGES/DRESSINGS) ×8 IMPLANT
BASKET HEART (ORDER IN 25'S) (MISCELLANEOUS) ×1
BASKET HEART (ORDER IN 25S) (MISCELLANEOUS) ×3 IMPLANT
BLADE STERNUM SYSTEM 6 (BLADE) ×4 IMPLANT
BNDG GAUZE ELAST 4 BULKY (GAUZE/BANDAGES/DRESSINGS) ×8 IMPLANT
CANISTER SUCTION 2500CC (MISCELLANEOUS) ×4 IMPLANT
CANNULA EZ GLIDE AORTIC 21FR (CANNULA) ×4 IMPLANT
CATH CPB KIT HENDRICKSON (MISCELLANEOUS) ×4 IMPLANT
CATH ROBINSON RED A/P 18FR (CATHETERS) ×4 IMPLANT
CATH THORACIC 36FR (CATHETERS) ×4 IMPLANT
CATH THORACIC 36FR RT ANG (CATHETERS) ×4 IMPLANT
CLIP FOGARTY SPRING 6M (CLIP) ×4 IMPLANT
CLIP TI MEDIUM 24 (CLIP) ×4 IMPLANT
CLIP TI WIDE RED SMALL 24 (CLIP) ×4 IMPLANT
CRADLE DONUT ADULT HEAD (MISCELLANEOUS) ×4 IMPLANT
DRAPE CARDIOVASCULAR INCISE (DRAPES) ×1
DRAPE SLUSH/WARMER DISC (DRAPES) ×4 IMPLANT
DRAPE SRG 135X102X78XABS (DRAPES) ×3 IMPLANT
DRSG COVADERM 4X14 (GAUZE/BANDAGES/DRESSINGS) ×4 IMPLANT
ELECT REM PT RETURN 9FT ADLT (ELECTROSURGICAL) ×8
ELECTRODE REM PT RTRN 9FT ADLT (ELECTROSURGICAL) ×6 IMPLANT
GAUZE SPONGE 4X4 12PLY STRL (GAUZE/BANDAGES/DRESSINGS) ×8 IMPLANT
GLOVE BIO SURGEON STRL SZ 6.5 (GLOVE) ×28 IMPLANT
GLOVE BIO SURGEON STRL SZ7 (GLOVE) ×16 IMPLANT
GLOVE BIOGEL M STER SZ 6 (GLOVE) ×12 IMPLANT
GLOVE SURG SIGNA 7.5 PF LTX (GLOVE) ×12 IMPLANT
GOWN STRL REUS W/ TWL LRG LVL3 (GOWN DISPOSABLE) ×15 IMPLANT
GOWN STRL REUS W/ TWL XL LVL3 (GOWN DISPOSABLE) ×6 IMPLANT
GOWN STRL REUS W/TWL LRG LVL3 (GOWN DISPOSABLE) ×5
GOWN STRL REUS W/TWL XL LVL3 (GOWN DISPOSABLE) ×2
HEMOSTAT POWDER SURGIFOAM 1G (HEMOSTASIS) ×12 IMPLANT
HEMOSTAT SURGICEL 2X14 (HEMOSTASIS) ×4 IMPLANT
INSERT FOGARTY XLG (MISCELLANEOUS) IMPLANT
KIT BASIN OR (CUSTOM PROCEDURE TRAY) ×4 IMPLANT
KIT ROOM TURNOVER OR (KITS) ×4 IMPLANT
KIT SUCTION CATH 14FR (SUCTIONS) ×8 IMPLANT
KIT VASOVIEW W/TROCAR VH 2000 (KITS) ×4 IMPLANT
MARKER GRAFT CORONARY BYPASS (MISCELLANEOUS) ×12 IMPLANT
NEEDLE 21X1 OR PACK (NEEDLE) ×4 IMPLANT
NS IRRIG 1000ML POUR BTL (IV SOLUTION) ×24 IMPLANT
PACK OPEN HEART (CUSTOM PROCEDURE TRAY) ×4 IMPLANT
PAD ARMBOARD 7.5X6 YLW CONV (MISCELLANEOUS) ×8 IMPLANT
PAD ELECT DEFIB RADIOL ZOLL (MISCELLANEOUS) ×4 IMPLANT
PENCIL BUTTON HOLSTER BLD 10FT (ELECTRODE) ×4 IMPLANT
PUNCH AORTIC ROTATE 4.0MM (MISCELLANEOUS) IMPLANT
PUNCH AORTIC ROTATE 4.5MM 8IN (MISCELLANEOUS) IMPLANT
PUNCH AORTIC ROTATE 5MM 8IN (MISCELLANEOUS) IMPLANT
SET CARDIOPLEGIA MPS 5001102 (MISCELLANEOUS) ×4 IMPLANT
SPONGE GAUZE 4X4 12PLY STER LF (GAUZE/BANDAGES/DRESSINGS) ×8 IMPLANT
SUT BONE WAX W31G (SUTURE) ×4 IMPLANT
SUT MNCRL AB 4-0 PS2 18 (SUTURE) IMPLANT
SUT PROLENE 3 0 SH DA (SUTURE) ×4 IMPLANT
SUT PROLENE 4 0 RB 1 (SUTURE) ×1
SUT PROLENE 4 0 SH DA (SUTURE) IMPLANT
SUT PROLENE 4-0 RB1 .5 CRCL 36 (SUTURE) ×3 IMPLANT
SUT PROLENE 6 0 C 1 30 (SUTURE) ×20 IMPLANT
SUT PROLENE 6 0 C1 VI BL (SUTURE) ×4 IMPLANT
SUT PROLENE 7 0 BV1 MDA (SUTURE) ×8 IMPLANT
SUT PROLENE 8 0 BV175 6 (SUTURE) ×8 IMPLANT
SUT STEEL 6MS V (SUTURE) ×4 IMPLANT
SUT STEEL STERNAL CCS#1 18IN (SUTURE) IMPLANT
SUT STEEL SZ 6 DBL 3X14 BALL (SUTURE) ×4 IMPLANT
SUT VIC AB 1 CTX 36 (SUTURE) ×2
SUT VIC AB 1 CTX36XBRD ANBCTR (SUTURE) ×6 IMPLANT
SUT VIC AB 2-0 CT1 27 (SUTURE)
SUT VIC AB 2-0 CT1 TAPERPNT 27 (SUTURE) IMPLANT
SUT VIC AB 2-0 CTX 27 (SUTURE) IMPLANT
SUT VIC AB 3-0 SH 27 (SUTURE)
SUT VIC AB 3-0 SH 27X BRD (SUTURE) IMPLANT
SUT VIC AB 3-0 X1 27 (SUTURE) IMPLANT
SUT VICRYL 4-0 PS2 18IN ABS (SUTURE) IMPLANT
SUTURE E-PAK OPEN HEART (SUTURE) ×4 IMPLANT
SYR 50ML LL SCALE MARK (SYRINGE) ×4 IMPLANT
SYSTEM SAHARA CHEST DRAIN ATS (WOUND CARE) ×4 IMPLANT
TOWEL OR 17X24 6PK STRL BLUE (TOWEL DISPOSABLE) ×8 IMPLANT
TOWEL OR 17X26 10 PK STRL BLUE (TOWEL DISPOSABLE) ×8 IMPLANT
TRAY FOLEY IC TEMP SENS 16FR (CATHETERS) ×4 IMPLANT
TUBE FEEDING 8FR 16IN STR KANG (MISCELLANEOUS) ×4 IMPLANT
TUBING INSUFFLATION (TUBING) ×4 IMPLANT
UNDERPAD 30X30 INCONTINENT (UNDERPADS AND DIAPERS) ×4 IMPLANT
WATER STERILE IRR 1000ML POUR (IV SOLUTION) ×8 IMPLANT

## 2015-04-20 NOTE — Interval H&P Note (Signed)
History and Physical Interval Note:  ABI showed significant compromise bilaterally.    04/20/2015 7:59 AM  Alexis Moss  has presented today for surgery, with the diagnosis of CAD  The various methods of treatment have been discussed with the patient and family. After consideration of risks, benefits and other options for treatment, the patient has consented to  Procedure(s): CORONARY ARTERY BYPASS GRAFTING (CABG) (N/A) TRANSESOPHAGEAL ECHOCARDIOGRAM (TEE) (N/A) as a surgical intervention .  The patient's history has been reviewed, patient examined, no change in status, stable for surgery.  I have reviewed the patient's chart and labs.  Questions were answered to the patient's satisfaction.     Loreli SlotSteven C Alvaretta Eisenberger

## 2015-04-20 NOTE — Transfer of Care (Signed)
Immediate Anesthesia Transfer of Care Note  Patient: Alexis KielCarol Moss  Procedure(s) Performed: Procedure(s): CORONARY ARTERY BYPASS GRAFTING (CABG) x 4 Using left internal mammary artery and saphenous vein. (N/A) TRANSESOPHAGEAL ECHOCARDIOGRAM (TEE) (N/A) BILATERAL SAPHENOUS VEIN HARVEST (Bilateral)  Patient Location: ICU  Anesthesia Type:General  Level of Consciousness: sedated and Patient remains intubated per anesthesia plan  Airway & Oxygen Therapy: Patient remains intubated per anesthesia plan and Patient placed on Ventilator (see vital sign flow sheet for setting)  Post-op Assessment: Report given to RN and Post -op Vital signs reviewed and stable  Post vital signs: Reviewed and stable  Last Vitals:  Filed Vitals:   04/20/15 0421  BP: 166/51  Pulse: 57  Temp: 36.8 C  Resp: 18    Complications: No apparent anesthesia complications

## 2015-04-20 NOTE — Anesthesia Procedure Notes (Signed)
Procedures Procedures: Right IJ Swan Ganz Catheter Insertion: [id number]: The patient was identified and consent obtained.  TO was performed, and full barrier precautions were used.  The skin was anesthetized with lidocaine-4cc plain with 25g needle.  Once the vein was located with the 22 ga. needle using ultrasound guidance , the wire was inserted into the vein.  The wire location was confirmed with ultrasound.  The tissue was dilated and the 8.5 French cordis catheter was carefully inserted. Afterwards Swan Ganz catheter was inserted. PA catheter at 48cm.  The patient tolerated the procedure well.   CE  

## 2015-04-20 NOTE — Anesthesia Preprocedure Evaluation (Signed)
Anesthesia Evaluation  Patient identified by MRN, date of birth, ID band Patient awake    Reviewed: Allergy & Precautions, NPO status , Patient's Chart, lab work & pertinent test results  History of Anesthesia Complications Negative for: history of anesthetic complications  Airway Mallampati: II  TM Distance: >3 FB Neck ROM: Full    Dental  (+) Teeth Intact   Pulmonary neg pulmonary ROS,  breath sounds clear to auscultation        Cardiovascular hypertension, Pt. on medications and Pt. on home beta blockers + angina + CAD and + Past MI - CHF - dysrhythmias Rhythm:Regular     Neuro/Psych negative neurological ROS  negative psych ROS   GI/Hepatic negative GI ROS, Neg liver ROS,   Endo/Other  negative endocrine ROS  Renal/GU negative Renal ROS     Musculoskeletal   Abdominal   Peds  Hematology negative hematology ROS (+)   Anesthesia Other Findings   Reproductive/Obstetrics                             Anesthesia Physical Anesthesia Plan  ASA: IV  Anesthesia Plan: General   Post-op Pain Management:    Induction: Intravenous  Airway Management Planned: Oral ETT  Additional Equipment: Arterial line, TEE, CVP, PA Cath and Ultrasound Guidance Line Placement  Intra-op Plan:   Post-operative Plan: Post-operative intubation/ventilation  Informed Consent: I have reviewed the patients History and Physical, chart, labs and discussed the procedure including the risks, benefits and alternatives for the proposed anesthesia with the patient or authorized representative who has indicated his/her understanding and acceptance.   Dental advisory given  Plan Discussed with: CRNA and Surgeon  Anesthesia Plan Comments:         Anesthesia Quick Evaluation

## 2015-04-20 NOTE — Progress Notes (Signed)
Placed pt back on previous settings due to apnea and no pt compliance to wean

## 2015-04-20 NOTE — Brief Op Note (Addendum)
04/18/2015 - 04/20/2015  12:55 PM  PATIENT:  Alexis Moss  74 y.o. female  PRE-OPERATIVE DIAGNOSIS:  LEFT MAIN/ 3 VESSEL CAD  POST-OPERATIVE DIAGNOSIS:  LEFT MAIN/ 3 VESSEL CAD  PROCEDURE:   CORONARY ARTERY BYPASS GRAFTING x 4 (  LIMA to LAD  SVG to Diagonal  SVG to OM  SVG to dRCA ENDOSCOPIC GREATER SAPHENOUS VEIN HARVEST BILATERAL THIGHS  SURGEON:  Surgeon(s): Loreli SlotSteven C Keean Wilmeth, MD  ASSISTANT: Coral CeoGina Collins, PA-C  ANESTHESIA:   general  PATIENT CONDITION:  ICU - intubated and hemodynamically stable.  PRE-OPERATIVE WEIGHT: 57.9 kg  XC= 70 min CPB= 100 min  LIMA good quality, vein fair, targets good quality LVH, MAC, preserved LV systolic function

## 2015-04-20 NOTE — Progress Notes (Signed)
Pt failed weaning at this time, pt having periods of apnea.  Vent returned to previous settings.  Will re-attempt when pt more awake.  Roselie AwkwardShannon Wilfrid Hyser, RN

## 2015-04-20 NOTE — H&P (View-Only) (Signed)
    [address]       [place] [address]             [phone]        [facility] Medical Record #[id number] Date of Birth: 07/04/1941  Referring: Jordan ( Primary Skains) Primary Care: No primary care provider on file.  Chief Complaint:    Chief Complaint  Patient presents with  . Chest Pain    History of Present Illness:      Mrs. Antillon is a 74 yo white female with no previous history of CAD but recent diagnosis of HTN.  She was initially evaluated 4/28 via the [facility] ED after presenting with complaints of fatigue, diaphoresis and "feeling odd."  This developed during training for a new job at the local [employer].  During that admission she admitted to several day headache with visual disturbances.  She had also not eaten much on the days leading to admission because rather than take medication for hypertension she wished to try diet modification.  In the ED her EKG showed SR with a RBBB.  Cardiac enzymes were minimally elevated.  She was evaluated by Dr. Skains who recommended Cardiac catheterization, however the patient declined and wished to follow up on an outpatient basis and she was discharged home.  However patient developed chest pain on 04/17/2015.  Initially NTG provided relief, however her pain continued through 04/18/2015 and was unrelieved with NTG.  Therefore she presented to the ED for evaluation.  Her cardiac markers were minimally elevated, but were decreasing from previous ED visit.  She was placed on Heparin and Nitroglycerin and admitted for further care.  The patient was agreeable to proceed with catheterization.  This was done on 04/19/2015 which showed 3 vessel CAD with LM involvement.  It was felt Coronary Artery Bypass Graft would be indicated and TCTS consult was requested.  Currently the patient is chest pain free.  For the most part she is able to do whatever she wants.  However, she states that she can not walk very far  because her legs become very heavy.  She also states that her extremities swell if she has been sitting too long.  She denies ever smoking.  In regards to medication use she states that she prefers to not take any medications.  She does ask why stents were not able to be placed.    Current Activity/ Functional Status: Patient is independent with mobility/ambulation, transfers, ADL's, IADL's.   Zubrod Score: At the time of surgery this patient's most appropriate activity status/level should be described as: [x]    0    Normal activity, no symptoms []    1    Restricted in physical strenuous activity but ambulatory, able to do out light work []    2    Ambulatory and capable of self care, unable to do work activities, up and about                 more than 50%  Of the time                            []    3    Only limited self care, in bed greater than 50% of waking hours []    4    Completely disabled, no self care, confined to bed or chair []    5      Moribund  Past Medical History  Diagnosis Date  . Hypertension 03/2015    Past Surgical History  Procedure Laterality Date  . Tonsillectomy      History  Smoking status  . Never Smoker   Smokeless tobacco  . Not on file    History  Alcohol Use No    History   Social History  . Marital Status: Married    Spouse Name: N/A  . Number of Children: N/A  . Years of Education: N/A   Occupational History  . Not on file.   Social History Main Topics  . Smoking status: Never Smoker   . Smokeless tobacco: Not on file  . Alcohol Use: No  . Drug Use: No  . Sexual Activity: Not on file   Other Topics Concern  . Not on file   Social History Narrative    Allergies  Allergen Reactions  . Codeine     Makes me crazy    Current Facility-Administered Medications  Medication Dose Route Frequency Provider Last Rate Last Dose  . 0.9 %  sodium chloride infusion  250 mL Intravenous PRN Peter M Jordan, MD      . 0.9% sodium  chloride infusion  3 mL/kg/hr Intravenous Continuous Peter M Jordan, MD 175.8 mL/hr at 04/19/15 1453 3 mL/kg/hr at 04/19/15 1453  . acetaminophen (TYLENOL) tablet 650 mg  650 mg Oral Q4H PRN Bhavinkumar Bhagat, PA   650 mg at 04/19/15 1129  . amLODipine (NORVASC) tablet 5 mg  5 mg Oral Daily Paula Ross V, MD   5 mg at 04/19/15 0853  . aspirin EC tablet 81 mg  81 mg Oral Daily Bhavinkumar Bhagat, PA   81 mg at 04/19/15 0853  . carvedilol (COREG) tablet 6.25 mg  6.25 mg Oral BID WC Paula Ross V, MD   6.25 mg at 04/19/15 0823  . cholecalciferol (VITAMIN D) tablet 1,000 Units  1,000 Units Oral Daily Paula Ross V, MD   1,000 Units at 04/19/15 0852  . [START ON 04/20/2015] heparin ADULT infusion 100 units/mL (25000 units/250 mL)  800 Units/hr Intravenous Continuous Theresa D Egan, RPH      . hydrALAZINE (APRESOLINE) injection 10 mg  10 mg Intravenous Q6H PRN Bhavinkumar Bhagat, PA      . lisinopril (PRINIVIL,ZESTRIL) tablet 10 mg  10 mg Oral Daily Paula Ross V, MD   10 mg at 04/19/15 0853  . multivitamin with minerals tablet 1 tablet  1 tablet Oral Daily Paula Ross V, MD   1 tablet at 04/19/15 0855  . nitroGLYCERIN (NITROSTAT) SL tablet 0.4 mg  0.4 mg Sublingual Q5 Min x 3 PRN Bhavinkumar Bhagat, PA      . ondansetron (ZOFRAN) injection 4 mg  4 mg Intravenous Q6H PRN Bhavinkumar Bhagat, PA      . sodium chloride 0.9 % injection 3 mL  3 mL Intravenous Q12H Peter M Jordan, MD      . sodium chloride 0.9 % injection 3 mL  3 mL Intravenous PRN Peter M Jordan, MD      . vitamin C (ASCORBIC ACID) tablet 1,000 mg  1,000 mg Oral Daily Paula Ross V, MD   1,000 mg at 04/19/15 0856    Prescriptions prior to admission  Medication Sig Dispense Refill Last Dose  . amLODipine (NORVASC) 5 MG tablet Take 1 tablet (5 mg total) by mouth daily. 30 tablet 11 04/17/2015 at Unknown time  . Ascorbic Acid (VITAMIN C) 1000 MG tablet Take 1,000 mg by mouth   daily.   04/17/2015 at Unknown time  . aspirin EC 81 MG EC tablet Take 1 tablet  (81 mg total) by mouth daily.   04/18/2015 at Unknown time  . carvedilol (COREG) 6.25 MG tablet Take 1 tablet (6.25 mg total) by mouth 2 (two) times daily with a meal. 60 tablet 11 04/18/2015 at Unknown time  . Cholecalciferol (VITAMIN D PO) Take 1 tablet by mouth daily.   04/17/2015 at Unknown time  . Coconut Oil OIL Apply 1 application topically daily.   04/18/2015 at Unknown time  . lisinopril (PRINIVIL,ZESTRIL) 10 MG tablet Take 1 tablet (10 mg total) by mouth daily. 30 tablet 11 04/17/2015 at Unknown time  . MELATONIN PO Take 1 tablet by mouth daily as needed. For sleep per patient   few wks ag at Unknown time  . Multiple Vitamin (MULTIVITAMIN WITH MINERALS) TABS tablet Take 1 tablet by mouth daily.   04/17/2015 at Unknown time  . nitroGLYCERIN (NITROSTAT) 0.4 MG SL tablet Place 1 tablet (0.4 mg total) under the tongue every 5 (five) minutes x 3 doses as needed for chest pain. 25 tablet 12 04/18/2015 at Unknown time  . Potassium 99 MG TABS Take 1 tablet by mouth daily.   04/18/2015 at Unknown time    Family History  Problem Relation Age of Onset  . Protein S deficiency      "family"  . Deep vein thrombosis      "family"     Review of Systems:  Constitutional: negative for chills and fevers Eyes: negative, c/o throbbing behind right eye Respiratory: negative Cardiovascular: positive for chest pain and claudication, negative for dyspnea, orthopnea and paroxysmal nocturnal dyspnea Genitourinary:negative, no history of clot but anxious about Fam Hx of protein S deficiency Hematologic/lymphatic: negative, see above, entry error in GU box instead of Heme Neurological: negative     Cardiac Review of Systems: Y or N  Chest Pain [  y  ]  Resting SOB [ n  ] Exertional SOB  [n  ]  Orthopnea [  ]   Pedal Edema [   ]    Palpitations [n  ] Syncope  [  ]   Presyncope [   ]  General Review of Systems: [Y] = yes [  ]=no Constitional: recent weight change [  ]; anorexia [  ]; fatigue [ y ]; nausea [  ]; night  sweats [  ]; fever [  ]; or chills [  ]                                                               Dental: poor dentition[  ]; Last Dentist visit:   Eye : blurred vision [  ]; diplopia [   ]; vision changes [y  ];  Amaurosis fugax[  ]; Resp: cough [  ];  wheezing[  ];  hemoptysis[  ]; shortness of breath[  ]; paroxysmal nocturnal dyspnea[  ]; dyspnea on exertion[  ]; or orthopnea[  ];  GI:  gallstones[  ], vomiting[  ];  dysphagia[  ]; melena[  ];  hematochezia [  ]; heartburn[  ];   Hx of  Colonoscopy[  ]; GU: kidney stones [  ]; hematuria[  ];   dysuria [  ];  nocturia[  ];    history of     obstruction [  ]; urinary frequency [  ]             Skin: rash, swelling[  ];, hair loss[  ];  peripheral edema[  ];  or itching[  ]; Musculosketetal: myalgias[  ];  joint swelling[  ];  joint erythema[  ];  joint pain[  ];  back pain[  ];  Heme/Lymph: bruising[  ];  bleeding[  ];  anemia[  ];  Neuro: TIA[  ];  headaches[y  ];  stroke[  ];  vertigo[  ];  seizures[  ];   paresthesias[  ];  difficulty walking[ y ];  Psych:depression[  ]; anxiety[  ];  Endocrine: diabetes[ n ];  thyroid dysfunction[  ];  Immunizations: Flu [  ]; Pneumococcal[  ];  Other:  Physical Exam: BP 168/57 mmHg  Pulse 61  Temp(Src) 98.2 F (36.8 C) (Oral)  Resp 10  Ht 5' 4" (1.626 m)  Wt 129 lb 3.2 oz (58.605 kg)  BMI 22.17 kg/m2  SpO2 100%   General appearance: alert, cooperative and no distress Head: Normocephalic, without obvious abnormality, atraumatic Neck: no adenopathy, no JVD, supple, symmetrical, trachea midline, thyroid not enlarged, symmetric, no tenderness/mass/nodules and Bilateral Carotid Bruit Resp: clear to auscultation bilaterally Cardio: regular rate and rhythm GI: soft, non-tender; bowel sounds normal; no masses,  no organomegaly Extremities: extremities normal, atraumatic, no cyanosis or edema Neurologic: Grossly normal  Diagnostic Studies & Laboratory data:     Recent Radiology Findings:   Dg  Chest 2 View  04/18/2015   CLINICAL DATA:  Left side chest pain for 3 days  EXAM: CHEST  2 VIEW  COMPARISON:  None.  FINDINGS: Cardiomediastinal silhouette is unremarkable. No acute infiltrate or pleural effusion. No pulmonary edema. Mild thoracic spine osteopenia. Mild degenerative changes mid thoracic spine.  IMPRESSION: No active cardiopulmonary disease.   Electronically Signed   By: Liviu  Pop M.D.   On: 04/18/2015 12:37     I have independently reviewed the above radiologic studies.  Recent Lab Findings: Lab Results  Component Value Date   WBC 6.4 04/19/2015   HGB 12.3 04/19/2015   HCT 37.8 04/19/2015   PLT 285 04/19/2015   GLUCOSE 84 04/19/2015   CHOL 230* 04/15/2015   TRIG 87 04/15/2015   HDL 41 04/15/2015   LDLCALC 172* 04/15/2015   ALT 12* 04/18/2015   AST 17 04/18/2015   NA 140 04/19/2015   K 3.6 04/19/2015   CL 101 04/19/2015   CREATININE 0.62 04/19/2015   BUN 9 04/19/2015   CO2 26 04/19/2015   TSH 2.257 04/15/2015   INR 1.09 04/19/2015   HGBA1C 5.9* 04/15/2015      Assessment / Plan:      1. CAD- requesting CABG consult 2. HTN- hypertensive on exam- on Coreg, Lisinopril, Hydralazine, Norvasc 3. Carotid Bruit- will need Carotid Dopplers    I  spent 30 minutes counseling the patient face to face and 50% or more the  time was spent in counseling and coordination of care. The total time spent in the appointment was 60 minutes.    @ME1@ 04/19/2015 3:50 PM  Patient seen and examined. History taken, physical exam performed. Cines reviewed.   73 yo woman with previously untreated hypertension and hyperlipidemia presents with second NSTEMI in a week. At catheterization she has left main disease. CABG is indicated for survival benefit and relief of symptoms.  I discussed the general nature of the procedure, the need   for general anesthesia, the use of cardiopulmonary bypass, and the incisions to be used with Mrs Linhardt and her husband. We discussed the expected  hospital stay, overall recovery and short and long term outcomes. I reviewed the indications, risks, benefits and alternatives(medical therapy). They understand the risks include but are not limited to death, stroke, MI, DVT/PE, bleeding, possible need for transfusion, infections, arrhythmias, and other organ system dysfunction including respiratory, renal, or GI complications.\  She is concerned about family history of protein S deficiency- will check labs. In meantime will plan to use pharmacologic prophylaxis postop.  She accepts the risks and agrees to proceed.  Carotid duplex showed extensive plaque but no hemodynamically significant stenosis.  Estanislado Surgeon C. Janzen Sacks, MD [facility] [phone]    

## 2015-04-20 NOTE — Progress Notes (Signed)
      [address]       [place] [id number]             [phone]      Intubated. Wakes up and follows commands  BP 123/63 mmHg  Pulse 86  Temp(Src) 97.7 F (36.5 C) (Core (Comment))  Resp 14  Ht 5\' 4"  (1.626 m)  Wt 127 lb 11.2 oz (57.924 kg)  BMI 21.91 kg/m2  SpO2 100%   Intake/Output Summary (Last 24 hours) at 04/20/15 1912 Last data filed at 04/20/15 1800  Gross per 24 hour  Intake 4886.34 ml  Output   4090 ml  Net 796.34 ml    Minimal CT output  Hct= 26  Doing well early postop  Wean vent as tolerated  Viviann SpareSteven C. Dorris FetchHendrickson, MD [facility] [phone]

## 2015-04-20 NOTE — Progress Notes (Signed)
Weaning parameters per protocol

## 2015-04-20 NOTE — Progress Notes (Signed)
Called on call surgeon about missing orders including the consent for the CABG and the consent for blood. Was told that he would try to put them in the computer. Still awaiting orders.  Tera Helperooper, Khiya Friese E

## 2015-04-20 NOTE — Progress Notes (Signed)
Placed pt back on wean per MD request

## 2015-04-21 ENCOUNTER — Encounter (HOSPITAL_COMMUNITY): Payer: Self-pay | Admitting: Thoracic Surgery (Cardiothoracic Vascular Surgery)

## 2015-04-21 ENCOUNTER — Inpatient Hospital Stay (HOSPITAL_COMMUNITY): Payer: Medicare Other

## 2015-04-21 LAB — GLUCOSE, CAPILLARY
GLUCOSE-CAPILLARY: 142 mg/dL — AB (ref 70–99)
Glucose-Capillary: 129 mg/dL — ABNORMAL HIGH (ref 70–99)
Glucose-Capillary: 99 mg/dL (ref 70–99)
Glucose-Capillary: 112 mg/dL — ABNORMAL HIGH (ref 70–99)
Glucose-Capillary: 96 mg/dL (ref 70–99)
Glucose-Capillary: 107 mg/dL — ABNORMAL HIGH (ref 70–99)
Glucose-Capillary: 130 mg/dL — ABNORMAL HIGH (ref 70–99)

## 2015-04-21 LAB — POCT I-STAT 3, ART BLOOD GAS (G3+)
ACID-BASE DEFICIT: 3 mmol/L — AB (ref 0.0–2.0)
Acid-base deficit: 2 mmol/L (ref 0.0–2.0)
Bicarbonate: 22 mEq/L (ref 20.0–24.0)
Bicarbonate: 22.7 mEq/L (ref 20.0–24.0)
O2 Saturation: 98 %
O2 Saturation: 99 %
PCO2 ART: 35 mmHg (ref 35.0–45.0)
PCO2 ART: 37.5 mmHg (ref 35.0–45.0)
PH ART: 7.418 (ref 7.350–7.450)
PO2 ART: 106 mmHg — AB (ref 80.0–100.0)
Patient temperature: 36.7
Patient temperature: 36.8
TCO2: 23 mmol/L (ref 0–100)
TCO2: 24 mmol/L (ref 0–100)
pH, Arterial: 7.377 (ref 7.350–7.450)
pO2, Arterial: 122 mmHg — ABNORMAL HIGH (ref 80.0–100.0)

## 2015-04-21 LAB — BASIC METABOLIC PANEL
Anion gap: 7 (ref 5–15)
BUN: 6 mg/dL (ref 6–20)
CO2: 24 mmol/L (ref 22–32)
Calcium: 8.2 mg/dL — ABNORMAL LOW (ref 8.9–10.3)
Chloride: 105 mmol/L (ref 101–111)
Creatinine, Ser: 0.62 mg/dL (ref 0.44–1.00)
GFR calc Af Amer: >60 mL/min (ref >60)
GLUCOSE: 128 mg/dL — AB (ref 70–99)
POTASSIUM: 3.3 mmol/L — AB (ref 3.5–5.1)
Sodium: 136 mmol/L (ref 135–145)

## 2015-04-21 LAB — CBC
HCT: 27.6 % — ABNORMAL LOW (ref 36.0–46.0)
HEMATOCRIT: 25.2 % — AB (ref 36.0–46.0)
HEMOGLOBIN: 8.5 g/dL — AB (ref 12.0–15.0)
Hemoglobin: 9.1 g/dL — ABNORMAL LOW (ref 12.0–15.0)
MCH: 27.8 pg (ref 26.0–34.0)
MCH: 27.6 pg (ref 26.0–34.0)
MCHC: 33.7 g/dL (ref 30.0–36.0)
MCHC: 33 g/dL (ref 30.0–36.0)
MCV: 82.4 fL (ref 78.0–100.0)
MCV: 83.6 fL (ref 78.0–100.0)
PLATELETS: 263 10*3/uL (ref 150–400)
Platelets: 186 10*3/uL (ref 150–400)
RBC: 3.06 MIL/uL — ABNORMAL LOW (ref 3.87–5.11)
RBC: 3.3 MIL/uL — AB (ref 3.87–5.11)
RDW: 13.2 % (ref 11.5–15.5)
RDW: 13.7 % (ref 11.5–15.5)
WBC: 9.8 10*3/uL (ref 4.0–10.5)
WBC: 15.9 10*3/uL — ABNORMAL HIGH (ref 4.0–10.5)

## 2015-04-21 LAB — HEMOGLOBIN A1C
Hgb A1c MFr Bld: 5.9 % — ABNORMAL HIGH (ref 4.8–5.6)
Mean Plasma Glucose: 123 mg/dL

## 2015-04-21 LAB — POCT I-STAT, CHEM 8
BUN: 13 mg/dL (ref 6–20)
CALCIUM ION: 1.24 mmol/L (ref 1.13–1.30)
CHLORIDE: 101 mmol/L (ref 101–111)
Creatinine, Ser: 1 mg/dL (ref 0.44–1.00)
GLUCOSE: 126 mg/dL — AB (ref 70–99)
HCT: 29 % — ABNORMAL LOW (ref 36.0–46.0)
Hemoglobin: 9.9 g/dL — ABNORMAL LOW (ref 12.0–15.0)
Potassium: 4.7 mmol/L (ref 3.5–5.1)
SODIUM: 136 mmol/L (ref 135–145)
TCO2: 20 mmol/L (ref 0–100)

## 2015-04-21 LAB — MAGNESIUM
MAGNESIUM: 2.4 mg/dL (ref 1.7–2.4)
MAGNESIUM: 2.4 mg/dL (ref 1.7–2.4)

## 2015-04-21 MED ORDER — POTASSIUM CHLORIDE 10 MEQ/50ML IV SOLN
10.0000 meq | INTRAVENOUS | Status: AC
  Administered 2015-04-21 (×3): 10 meq via INTRAVENOUS
  Filled 2015-04-21 (×3): qty 50

## 2015-04-21 MED ORDER — FUROSEMIDE 10 MG/ML IJ SOLN
20.0000 mg | Freq: Two times a day (BID) | INTRAMUSCULAR | Status: AC
Start: 2015-04-21 — End: 2015-04-21
  Administered 2015-04-21 (×2): 20 mg via INTRAVENOUS
  Filled 2015-04-21 (×2): qty 2

## 2015-04-21 MED ORDER — POTASSIUM CHLORIDE 10 MEQ/50ML IV SOLN
10.0000 meq | INTRAVENOUS | Status: AC
  Administered 2015-04-21 (×2): 10 meq via INTRAVENOUS

## 2015-04-21 MED ORDER — INSULIN DETEMIR 100 UNIT/ML ~~LOC~~ SOLN: 15.0000 [IU] | Freq: Every day | SUBCUTANEOUS | Status: DC

## 2015-04-21 MED ORDER — ENOXAPARIN SODIUM 40 MG/0.4ML ~~LOC~~ SOLN
40.0000 mg | Freq: Every day | SUBCUTANEOUS | Status: DC
  Administered 2015-04-21 – 2015-04-25 (×5): 40 mg via SUBCUTANEOUS
  Filled 2015-04-21 (×6): qty 0.4

## 2015-04-21 MED ORDER — INSULIN DETEMIR 100 UNIT/ML ~~LOC~~ SOLN
15.0000 [IU] | Freq: Every day | SUBCUTANEOUS | Status: DC
  Filled 2015-04-21 (×2): qty 0.15

## 2015-04-21 MED ORDER — INSULIN ASPART 100 UNIT/ML ~~LOC~~ SOLN
0.0000 [IU] | SUBCUTANEOUS | Status: DC
  Administered 2015-04-21: 2 [IU] via SUBCUTANEOUS

## 2015-04-21 MED FILL — Potassium Chloride Inj 2 mEq/ML: INTRAVENOUS | Qty: 40 | Status: AC

## 2015-04-21 MED FILL — Dexmedetomidine HCl in NaCl 0.9% IV Soln 400 MCG/100ML: INTRAVENOUS | Qty: 100 | Status: AC

## 2015-04-21 MED FILL — Magnesium Sulfate Inj 50%: INTRAMUSCULAR | Qty: 10 | Status: AC

## 2015-04-21 MED FILL — Heparin Sodium (Porcine) Inj 1000 Unit/ML: INTRAMUSCULAR | Qty: 30 | Status: AC

## 2015-04-21 NOTE — Progress Notes (Signed)
Attempting to wean pt, ABG within parameters (pH 7.42 pCO2 35 pO2 122 bicarb 22.7.  VC 204 359 0021, NIF -15.  Pt had to be continuously encouraged to breath with RN sitting at bedside during entire wean process.  Dr. Hendrickson notDorris Fetchified of above.  Ordered to continue with extubation.    Roselie AwkwardShannon Caine Barfield, RN

## 2015-04-21 NOTE — Op Note (Signed)
NAME:  BENITA, BOONSTRA NO.:  [id number]  MEDICAL RECORD NO.:  [id number]  LOCATION:  2S12C                        FACILITY:  [facility]  PHYSICIAN:  Salvatore Decent. Dorris Fetch, M.D.DATE OF BIRTH:  02-17-1941  DATE OF PROCEDURE:  04/20/2015 DATE OF DISCHARGE:                              OPERATIVE REPORT   PREOPERATIVE DIAGNOSIS:  Left main and three-vessel disease, status post non-ST elevation myocardial infarction.  POSTOPERATIVE DIAGNOSIS:  Left main and three-vessel disease, status post non-ST elevation myocardial infarction.  PROCEDURE:  Median sternotomy, extracorporeal circulation Coronary artery bypass grafting x4  Left internal mammary artery to left anterior descending  Saphenous vein graft to first diagonal  Saphenous vein graft to obtuse marginal  Saphenous vein graft to distal right coronary Endoscopic vein harvest, bilateral thighs.  SURGEON:  Salvatore Decent. Dorris Fetch, MD.  ASSISTANT:  Coral Ceo, PA-C  ANESTHESIA:  General.  FINDINGS:  Mammary good quality, saphenous vein fair quality. Bifurcated system in both thighs, therefore, both thigh veins had to be harvested.  Targets good quality at site of anastomosis, but diffuse plaquing throughout all the vessels.  Transesophageal echocardiography revealed left ventricular hypertrophy.  Mitral annular calcification and preserved left ventricular systolic function.  CLINICAL NOTE:  Ms. Krasinski is a 74 year old woman who was recently diagnosed with hypertension and hyperlipidemia.  She had 2 admissions within a week where she had positive troponin suggesting non-ST elevation MI.  On the first admission, she refused catheterization and was discharged. However, on this admission, she proceeded with catheterization and was found to have a 73% ostial left main stenosis. There was significant disease in the LAD at the bifurcation of the diagonal. There was also a significant stenosis at the ostium of  the right coronary.  There was diffuse plaque throughout the vessels.  She had preserved systolic function.  She was referred for coronary artery bypass grafting.  The indications, risks, benefits, and alternatives were discussed in detail with the patient.  She understood and accepted the risks and agreed to proceed.  OPERATIVE NOTE:  Ms. Garretson was brought to the preoperative holding area on Apr 20, 2015.  A consent form had not been signed.  She gave verbal consent, but had received sedation, therefore, her husband was contacted to give a telephone consent, so that we could proceed.  She had a Swan-Ganz catheter and arterial blood pressure monitoring line placed.  She was taken to the operating room, anesthetized, and intubated.  Intravenous antibiotics were administered.  A Foley catheter was placed.  The chest, abdomen, and legs were prepped and draped in the usual sterile fashion.  Transesophageal echocardiography was performed. It revealed left ventricular hypertrophy, preserved left ventricular systolic function.  There was severe mitral annular calcification, but no mitral regurgitation.  There was 2+ tricuspid regurgitation.  The chest, abdomen, and legs were prepped and draped in usual sterile fashion.  A median sternotomy was performed.  There was sternal osteoporosis. The left internal mammary artery was harvested using standard technique. Simultaneously, an incision was made in the medial aspect of the left leg at the level of the knee.  The greater saphenous vein was identified.  It was harvested endoscopically from the upper  calf to the groin.  2000 units of heparin was administered during the vessel harvest.  After harvesting the vessels, the remainder of the full heparin dose was given.  After confirming adequate anticoagulation with ACT measurement, the aorta was cannulated via concentric 2-0 Ethibond pledgeted pursestring sutures.  A dual-stage venous cannula was  placed via pursestring right atrial appendage.  Before going on bypass, the vein was inspected and a large portion of the vein was not acceptable for use as a bypass graft due to the small caliber. It was a bifurcated system and the proximal thigh portion was usable as was a small segment of the distal thigh portion.  Additional vein then was harvested endoscopically from the right thigh.  This also was a bifurcated system with a marked caliber change near the proximal end.  The vein overall was of fair quality, but all the vein that was utilized for bypass was satisfactory.  Cardiopulmonary bypass was initiated.  Flows were maintained per protocol.  The patient was cooled to 32 degrees Celsius. The coronary arteries were inspected and anastomotic sites were chosen. The conduits were inspected and prepared.  A foam pad was placed in the pericardium to insulate the heart and protect the left phrenic nerve.  A temperature probe was placed in the myocardial septum and a cardioplegia cannula was placed in the ascending aorta.  The aorta was crossclamped.  The left ventricle was emptied via the aortic root vent.  Cardiac arrest then was achieved with combination of cold antegrade blood cardioplegia and topical iced saline.  1 L of cardioplegia was administered.  There was a rapid diastolic arrest. Initially, there was relatively slow septal cooling to 15 degrees Celsius.  A reversed saphenous vein graft then was placed end-to-side to the first diagonal branch to the LAD.  This vessel was a 1.5 mm vessel.  There was plaque distal to the anastomosis, but the probe passed.  The vein was anastomosed end-to-side with a running 7-0 Prolene suture.  At the completion of the anastomosis, cardioplegia was administered down the graft.  There also was additional cardioplegia administered down the aortic root at the same time and there was additional septal cooling to 9 degrees Celsius.  Next, a  reversed saphenous vein graft was placed end-to-side to the distal right coronary.  Due to the vein constraints, right coronary was grafted just over the acute margin of the heart.  It was a 2 mm good quality target at that site.  The vein graft was relatively large, but was usable and was anastomosed end-to-side with a running 7-0 Prolene suture.  With cardioplegia administration, there was good flow and good hemostasis.  Next, the heart was elevated exposing the obtuse marginal branch of the circumflex.  This vessel had some diffuse plaquing in it, but was a large caliber vessel of good quality at the site of the anastomosis, and a probe did pass distally.  The vein was anastomosed end-to-side with a running 7-0 Prolene suture.  There was good flow and good hemostasis with cardioplegia administration.  Next, the left internal mammary artery was brought through a window in the pericardium.  The distal end was beveled.  It was a 2 mm good quality conduit.  It was anastomosed end-to-side to the distal LAD.  The LAD was a 1.5 mm good quality target.  An end-to-side anastomosis was performed with a running 8-0 Prolene suture.  At the completion of the anastomosis, the bulldog clamp was briefly removed.  Rapid  septal rewarming was noted.  The bulldog clamp was replaced and the mammary pedicle was tacked to the epicardial surface of the heart with 6-0 Prolene sutures.  Additional cardioplegia was administered.  The vein grafts were cut to length.  The cardioplegia cannula was removed from the ascending aorta. The proximal vein graft anastomoses were performed to 4.5 mm punch aortotomies with running 6-0 Prolene sutures.  At the completion of the final proximal anastomosis, the patient was placed in Trendelenburg position.  Lidocaine was administered.  The aortic root was de-aired. The bulldog clamps again removed from the left mammary artery.  After de- airing the aortic root, the aortic  crossclamp was removed.  The total crossclamp time was 70 minutes.  The patient initially fibrillated.  She required a single defibrillation with 10 joules and then was in sinus rhythm thereafter.  While rewarming was completed, all proximal and distal anastomoses were inspected for hemostasis.  Epicardial pacing wires were placed on the right ventricle and right atrium.  When the patient had rewarmed to a core temperature of 37 degrees Celsius, she was weaned for cardiopulmonary bypass on the first attempt.  She was atrially paced at 80 beats per minute and on no inotropic support at the time of separation from bypass.  The total bypass time was 100 minutes. The initial cardiac index was greater than 2 L/minute/m2 and the patient remained hemodynamically stable throughout the postbypass period.  Postbypass transesophageal echocardiography was unchanged from the prebypass study. A test dose of protamine was administered and was well tolerated.  The atrial and aortic cannulae were removed.  The remainder of the protamine was administered without incident.  The chest was irrigated with warm saline.  Hemostasis was achieved.  A left pleural and mediastinal chest tubes were placed in a separate subcostal incisions.  The pericardium was reapproximated with interrupted 3-0 silk sutures.  It came together easily without tension.  The sternum was closed with a combination of single and double heavy gauge stainless steel wires.  The pectoralis fascia, subcutaneous tissue, and skin were closed in standard fashion. All sponge, needle, and instrument counts were correct at the end of the procedure.  The patient was taken from the operating room to the surgical intensive care unit in good condition.     Salvatore DecentSteven C. Dorris FetchHendrickson, M.D.     SCH/MEDQ  D:  04/20/2015  T:  04/21/2015  Job:  [id number]

## 2015-04-21 NOTE — Care Management Note (Signed)
Case Management Note  Patient Details  Name: Alexis Moss MRN: [id number] Date of Birth: Jan 07, 1941  Subjective/Objective:                  Patient lives at home with husband.  Independent prior.  Plan is for return to home.  Husband will be with her 24/7.  CM will continue to follow for further needs.   Action/Plan:   Expected Discharge Date:  04/25/15               Expected Discharge Plan:  Home w Home Health Services  In-House Referral:     Discharge planning Services     Post Acute Care Choice:    Choice offered to:     DME Arranged:    DME Agency:     HH Arranged:    HH Agency:     Status of Service:  In process, will continue to follow  Medicare Important Message Given:    Date Medicare IM Given:    Medicare IM give by:    Date Additional Medicare IM Given:    Additional Medicare Important Message give by:     If discussed at Long Length of Stay Meetings, dates discussed:    Additional Comments:  Vangie BickerBrown, Amika Tassin Jane, RN 04/21/2015, 10:54 AM

## 2015-04-21 NOTE — Anesthesia Postprocedure Evaluation (Signed)
  Anesthesia Post-op Note  Patient: Magda KielCarol Finks  Procedure(s) Performed: Procedure(s): CORONARY ARTERY BYPASS GRAFTING (CABG) x 4 Using left internal mammary artery and saphenous vein. (N/A) TRANSESOPHAGEAL ECHOCARDIOGRAM (TEE) (N/A) BILATERAL SAPHENOUS VEIN HARVEST (Bilateral)  Patient Location: ICU  Anesthesia Type:General  Level of Consciousness: sedated  Airway and Oxygen Therapy: Patient remains intubated per anesthesia plan  Post-op Pain: none  Post-op Assessment: Post-op Vital signs reviewed, Patient's Cardiovascular Status Stable, Respiratory Function Stable, Patent Airway and Pain level controlled  Post-op Vital Signs: Reviewed and stable  Last Vitals:  Filed Vitals:   04/21/15 1500  BP: 146/55  Pulse: 67  Temp:   Resp: 18    Complications: No apparent anesthesia complications

## 2015-04-21 NOTE — Progress Notes (Signed)
CT surgery p.m. Rounds  Patient was slow progress, weakness with dizziness and nausea with motion, response to Zofran Ambulated in the hallway approximately 50 feet Blood pressure is been high, urine output reduced but positive response to IV Lasix P.m. labs reviewed and are satisfactory, creatinine 1.0, hematocrit 29

## 2015-04-21 NOTE — Progress Notes (Signed)
TCTS DAILY ICU PROGRESS NOTE                   [address]            [place] [id number]          [phone]   1 Day Post-Op Procedure(s) (LRB): CORONARY ARTERY BYPASS GRAFTING (CABG) x 4 Using left internal mammary artery and saphenous vein. (N/A) TRANSESOPHAGEAL ECHOCARDIOGRAM (TEE) (N/A) BILATERAL SAPHENOUS VEIN HARVEST (Bilateral)  Total Length of Stay:  LOS: 2 days   Subjective: Hungry and sore this am.  Overall feeling well.   Objective: Vital signs in last 24 hours: Temp:  [96.3 F (35.7 C)-98.8 F (37.1 C)] 98.8 F (37.1 C) (05/05 0600) Pulse Rate:  [50-87] 80 (05/05 0600) Cardiac Rhythm:  [-] Atrial paced (05/05 0400) Resp:  [10-19] 17 (05/05 0600) BP: (97-136)/(47-68) 117/57 mmHg (05/05 0600) SpO2:  [97 %-100 %] 97 % (05/05 0600) Arterial Line BP: (112-159)/(46-67) 141/53 mmHg (05/05 0600) FiO2 (%):  [40 %-50 %] 40 % (05/04 2336) Weight:  [134 lb 0.6 oz (60.8 kg)] 134 lb 0.6 oz (60.8 kg) (05/05 0500)  Filed Weights   04/18/15 2029 04/20/15 0421 04/21/15 0500  Weight: 129 lb 3.2 oz (58.605 kg) 127 lb 11.2 oz (57.924 kg) 134 lb 0.6 oz (60.8 kg)  PRE-OPERATIVE WEIGHT: 57.9 kg  Weight change: 6 lb 5.4 oz (2.876 kg)   Hemodynamic parameters for last 24 hours: PAP: (15-24)/(6-15) 24/11 mmHg CO:  [2.7 L/min-3.8 L/min] 3.7 L/min CI:  [1.7 L/min/m2-2.3 L/min/m2] 2.3 L/min/m2  Intake/Output from previous day: 05/04 0701 - 05/05 0700 In: 5816.2 [I.V.:3786.2; Blood:300; NG/GT:30; IV Piggyback:1700] Out: 4335 [Urine:3205; Blood:900; Chest Tube:230]  CBGs 113-138-143-142-129-128    Current Meds: Scheduled Meds: . acetaminophen  1,000 mg Oral 4 times per day   Or  . acetaminophen (TYLENOL) oral liquid 160 mg/5 mL  1,000 mg Per Tube 4 times per day  . aspirin EC  325 mg Oral Daily   Or  . aspirin  324 mg Per Tube Daily  . bisacodyl  10 mg Oral Daily   Or  . bisacodyl  10 mg Rectal Daily  . cefUROXime (ZINACEF)  IV  1.5 g Intravenous Q12H    . Chlorhexidine Gluconate Cloth  6 each Topical Daily  . cholecalciferol  1,000 Units Oral Daily  . docusate sodium  200 mg Oral Daily  . insulin aspart  0-24 Units Subcutaneous 6 times per day  . metoprolol tartrate  12.5 mg Oral BID   Or  . metoprolol tartrate  12.5 mg Per Tube BID  . mupirocin ointment  1 application Nasal BID  . [START ON 04/22/2015] pantoprazole  40 mg Oral Daily  . potassium chloride  10 mEq Intravenous Q1 Hr x 3  . sodium chloride  3 mL Intravenous Q12H  . vitamin C  1,000 mg Oral Daily   Continuous Infusions: . sodium chloride 20 mL/hr at 04/21/15 0600  . sodium chloride    . sodium chloride 20 mL/hr at 04/20/15 1800  . dexmedetomidine Stopped (04/20/15 1715)  . lactated ringers 20 mL/hr at 04/21/15 0600  . lactated ringers    . nitroGLYCERIN Stopped (04/21/15 0520)  . phenylephrine (NEO-SYNEPHRINE) Adult infusion 0 mcg/min (04/20/15 1445)   PRN Meds:.sodium chloride, hydrALAZINE, labetalol, metoprolol, midazolam, morphine injection, ondansetron (ZOFRAN) IV, oxyCODONE, sodium chloride, traMADol  Physical Exam: General appearance: alert, cooperative and no distress Heart: regular rate and rhythm Lungs: diminished breath sounds bibasilar and  bilaterally Extremities: No significant LE edema Wound: Dressed and dry   Lab Results: CBC: Recent Labs  04/20/15 2035 04/21/15 0355  WBC 8.1 9.8  HGB 8.5* 8.5*  HCT 25.5* 25.2*  PLT 172 186   BMET:  Recent Labs  04/19/15 0750  04/20/15 2033 04/20/15 2035 04/21/15 0355  NA 140  < > 137  --  136  K 3.6  < > 3.8  --  3.3*  CL 101  < > 105  --  105  CO2 26  --   --   --  24  GLUCOSE 84  < > 143*  --  128*  BUN 9  < > 6  --  6  CREATININE 0.62  < > 0.50 0.68 0.62  CALCIUM 9.2  --   --   --  8.2*  < > = values in this interval not displayed.  PT/INR:  Recent Labs  04/20/15 1505  LABPROT 18.1*  INR 1.48   Radiology:   EXAM: PORTABLE CHEST - 1 VIEW  COMPARISON: 04/20/2015  FINDINGS: There  is removal of the endotracheal tube and nasogastric tube. The right jugular Swan-Ganz catheter remains. Mediastinal drain and left chest tube remain. There is no pneumothorax. There is mild atelectatic appearing linear basilar opacity on the left.  IMPRESSION: Removal of ET tube and NG tube. No pneumothorax. Mild left base Atelectasis.   Assessment/Plan: S/P Procedure(s) (LRB): CORONARY ARTERY BYPASS GRAFTING (CABG) x 4 Using left internal mammary artery and saphenous vein. (N/A) TRANSESOPHAGEAL ECHOCARDIOGRAM (TEE) (N/A) BILATERAL SAPHENOUS VEIN HARVEST (Bilateral)  CV- BPs stable, off all drips. Maintaining SR.  Start low dose beta blocker.  Pulm- IS/pulm toilet.  Expected postop blood loss anemia- H/H stable.  Hypokalemia- replace K+.  Mobilize, d/c lines and tubes, routine POD#1 progression.  Rashae Rother H 04/21/2015 7:35 AM

## 2015-04-21 NOTE — Progress Notes (Signed)
1 Day Post-Op Procedure(s) (LRB): CORONARY ARTERY BYPASS GRAFTING (CABG) x 4 Using left internal mammary artery and saphenous vein. (N/A) TRANSESOPHAGEAL ECHOCARDIOGRAM (TEE) (N/A) BILATERAL SAPHENOUS VEIN HARVEST (Bilateral) Subjective: C/o incisional pain, " a little queasy"  Objective: Vital signs in last 24 hours: Temp:  [96.3 F (35.7 C)-98.8 F (37.1 C)] 98.8 F (37.1 C) (05/05 0600) Pulse Rate:  [51-87] 80 (05/05 0600) Cardiac Rhythm:  [-] Atrial paced (05/05 0400) Resp:  [10-19] 17 (05/05 0600) BP: (97-136)/(47-68) 117/57 mmHg (05/05 0600) SpO2:  [97 %-100 %] 97 % (05/05 0600) Arterial Line BP: (112-159)/(46-67) 141/53 mmHg (05/05 0600) FiO2 (%):  [40 %-50 %] 40 % (05/04 2336) Weight:  [134 lb 0.6 oz (60.8 kg)] 134 lb 0.6 oz (60.8 kg) (05/05 0500)  Hemodynamic parameters for last 24 hours: PAP: (15-24)/(6-15) 24/11 mmHg CO:  [2.7 L/min-3.8 L/min] 3.7 L/min CI:  [1.7 L/min/m2-2.3 L/min/m2] 2.3 L/min/m2  Intake/Output from previous day: 05/04 0701 - 05/05 0700 In: 5816.2 [I.V.:3786.2; Blood:300; NG/GT:30; IV Piggyback:1700] Out: 4335 [Urine:3205; Blood:900; Chest Tube:230] Intake/Output this shift:    General appearance: alert and no distress Neurologic: intact Heart: regular rate and rhythm and + rub Lungs: diminished breath sounds bibasilar Abdomen: normal findings: soft, non-tender  Lab Results:  Recent Labs  04/20/15 2035 04/21/15 0355  WBC 8.1 9.8  HGB 8.5* 8.5*  HCT 25.5* 25.2*  PLT 172 186   BMET:  Recent Labs  04/19/15 0750  04/20/15 2033 04/20/15 2035 04/21/15 0355  NA 140  < > 137  --  136  K 3.6  < > 3.8  --  3.3*  CL 101  < > 105  --  105  CO2 26  --   --   --  24  GLUCOSE 84  < > 143*  --  128*  BUN 9  < > 6  --  6  CREATININE 0.62  < > 0.50 0.68 0.62  CALCIUM 9.2  --   --   --  8.2*  < > = values in this interval not displayed.  PT/INR:  Recent Labs  04/20/15 1505  LABPROT 18.1*  INR 1.48   ABG    Component Value  Date/Time   PHART 7.377 04/21/2015 0144   HCO3 22.0 04/21/2015 0144   TCO2 23 04/21/2015 0144   ACIDBASEDEF 3.0* 04/21/2015 0144   O2SAT 98.0 04/21/2015 0144   CBG (last 3)   Recent Labs  04/20/15 1923 04/21/15 0026 04/21/15 0345  GLUCAP 138* 142* 129*    Assessment/Plan: S/P Procedure(s) (LRB): CORONARY ARTERY BYPASS GRAFTING (CABG) x 4 Using left internal mammary artery and saphenous vein. (N/A) TRANSESOPHAGEAL ECHOCARDIOGRAM (TEE) (N/A) BILATERAL SAPHENOUS VEIN HARVEST (Bilateral) POD # 1  CV- stable, in SR, AP for rate, good index  Dc swan, A line  ASA, beta blocker- will hopefully be able to talk her into taking a statin  RESP- mild left lower lobe atelectasis on chest x ray- IS  RENAL- hypokalemia - supplement K  Diurese  ENDO- CBG OK- transition to levemir + SSI  DC CT  DVT prophylaxis- protein S panel sent- still pending  SCD + enoxaparin  OOB, ambulate   LOS: 2 days    Alexis Moss 04/21/2015

## 2015-04-21 NOTE — Procedures (Signed)
Extubation Procedure Note  Patient Details:   Name: Alexis Moss DOB: 1941-03-12 MRN: [id number]   Airway Documentation:  Airway 7 mm (Active)  Secured at (cm) 22 cm 04/20/2015  8:10 PM  Measured From Lips 04/20/2015  8:10 PM  Secured Location Right 04/20/2015  8:10 PM  Secured By Pink Tape 04/20/2015  8:10 PM  Cuff Pressure (cm H2O) 26 cm H2O 04/20/2015  2:35 PM  Site Condition Cool;Dry 04/20/2015  8:10 PM    Evaluation  O2 sats: stable throughout Complications: No apparent complications Patient did tolerate procedure well. Bilateral Breath Sounds: Clear   Yes, pt able to hoarsely state name and cough to clear secretions. Extubated pt to 4L nasal cannula. Pt had Vt from [phone] and Nif was not within parameters highest pt achieved was -15, RN called MD about low NIF and was told to continue with extubation.  Tacy Learnurriff, Ramces Shomaker E 04/21/2015, 12:24 AM

## 2015-04-22 ENCOUNTER — Inpatient Hospital Stay (HOSPITAL_COMMUNITY): Payer: Medicare Other

## 2015-04-22 LAB — BASIC METABOLIC PANEL WITH GFR
Anion gap: 8 (ref 5–15)
BUN: 19 mg/dL (ref 6–20)
CO2: 25 mmol/L (ref 22–32)
Calcium: 8.7 mg/dL — ABNORMAL LOW (ref 8.9–10.3)
Chloride: 103 mmol/L (ref 101–111)
Creatinine, Ser: 1.14 mg/dL — ABNORMAL HIGH (ref 0.44–1.00)
GFR calc Af Amer: 54 mL/min — ABNORMAL LOW
GFR calc non Af Amer: 47 mL/min — ABNORMAL LOW
Glucose, Bld: 115 mg/dL — ABNORMAL HIGH (ref 70–99)
Potassium: 4.4 mmol/L (ref 3.5–5.1)
Sodium: 136 mmol/L (ref 135–145)

## 2015-04-22 LAB — CBC
HCT: 26.4 % — ABNORMAL LOW (ref 36.0–46.0)
Hemoglobin: 8.8 g/dL — ABNORMAL LOW (ref 12.0–15.0)
MCH: 28 pg (ref 26.0–34.0)
MCHC: 33.3 g/dL (ref 30.0–36.0)
MCV: 84.1 fL (ref 78.0–100.0)
Platelets: 252 K/uL (ref 150–400)
RBC: 3.14 MIL/uL — ABNORMAL LOW (ref 3.87–5.11)
RDW: 14.1 % (ref 11.5–15.5)
WBC: 14 K/uL — ABNORMAL HIGH (ref 4.0–10.5)

## 2015-04-22 LAB — GLUCOSE, CAPILLARY
GLUCOSE-CAPILLARY: 106 mg/dL — AB (ref 70–99)
Glucose-Capillary: 97 mg/dL (ref 70–99)
Glucose-Capillary: 111 mg/dL — ABNORMAL HIGH (ref 70–99)
Glucose-Capillary: 121 mg/dL — ABNORMAL HIGH (ref 70–99)

## 2015-04-22 MED ORDER — ALBUMIN HUMAN 5 % IV SOLN
12.5000 g | Freq: Once | INTRAVENOUS | Status: AC
  Administered 2015-04-22: 12.5 g via INTRAVENOUS
  Filled 2015-04-22: qty 250

## 2015-04-22 MED ORDER — MAGNESIUM HYDROXIDE 400 MG/5ML PO SUSP: 30.0000 mL | Freq: Every day | ORAL | Status: DC | PRN

## 2015-04-22 MED ORDER — SODIUM CHLORIDE 0.9 % IV SOLN: INTRAVENOUS | Status: AC

## 2015-04-22 MED ORDER — ALUM & MAG HYDROXIDE-SIMETH 200-200-20 MG/5ML PO SUSP: 15.0000 mL | ORAL | Status: DC | PRN

## 2015-04-22 MED ORDER — INSULIN ASPART 100 UNIT/ML ~~LOC~~ SOLN
0.0000 [IU] | Freq: Three times a day (TID) | SUBCUTANEOUS | Status: DC
  Administered 2015-04-22: 2 [IU] via SUBCUTANEOUS

## 2015-04-22 MED ORDER — SODIUM CHLORIDE 0.9 % IV SOLN: 250.0000 mL | INTRAVENOUS | Status: DC | PRN

## 2015-04-22 MED ORDER — MOVING RIGHT ALONG BOOK
Freq: Once | Status: AC
  Administered 2015-04-22: 1
  Filled 2015-04-22: qty 1

## 2015-04-22 MED ORDER — ZOLPIDEM TARTRATE 5 MG PO TABS: 5.0000 mg | ORAL_TABLET | Freq: Every evening | ORAL | Status: DC | PRN

## 2015-04-22 MED ORDER — FUROSEMIDE 40 MG PO TABS
40.0000 mg | ORAL_TABLET | Freq: Every day | ORAL | Status: DC
  Administered 2015-04-23 – 2015-04-26 (×4): 40 mg via ORAL
  Filled 2015-04-22 (×4): qty 1

## 2015-04-22 MED ORDER — ALPRAZOLAM 0.25 MG PO TABS
0.2500 mg | ORAL_TABLET | Freq: Four times a day (QID) | ORAL | Status: DC | PRN
Start: 2015-04-22 — End: 2015-04-26

## 2015-04-22 MED ORDER — CARVEDILOL 3.125 MG PO TABS
3.1250 mg | ORAL_TABLET | Freq: Two times a day (BID) | ORAL | Status: DC
  Administered 2015-04-22 – 2015-04-23 (×4): 3.125 mg via ORAL
  Filled 2015-04-22 (×7): qty 1

## 2015-04-22 MED ORDER — GUAIFENESIN ER 600 MG PO TB12
600.0000 mg | ORAL_TABLET | Freq: Two times a day (BID) | ORAL | Status: DC | PRN
  Filled 2015-04-22: qty 1

## 2015-04-22 MED ORDER — ATORVASTATIN CALCIUM 20 MG PO TABS
20.0000 mg | ORAL_TABLET | Freq: Every day | ORAL | Status: DC
  Filled 2015-04-22: qty 1

## 2015-04-22 MED ORDER — METOCLOPRAMIDE HCL 10 MG PO TABS
10.0000 mg | ORAL_TABLET | Freq: Three times a day (TID) | ORAL | Status: AC
  Administered 2015-04-22 (×4): 10 mg via ORAL
  Filled 2015-04-22 (×4): qty 1

## 2015-04-22 MED ORDER — SODIUM CHLORIDE 0.9 % IJ SOLN
3.0000 mL | Freq: Two times a day (BID) | INTRAMUSCULAR | Status: DC
  Administered 2015-04-22 – 2015-04-26 (×9): 3 mL via INTRAVENOUS

## 2015-04-22 MED ORDER — SODIUM CHLORIDE 0.9 % IJ SOLN: 3.0000 mL | INTRAMUSCULAR | Status: DC | PRN

## 2015-04-22 MED ORDER — FUROSEMIDE 10 MG/ML IJ SOLN
40.0000 mg | Freq: Once | INTRAMUSCULAR | Status: AC
Start: 2015-04-22 — End: 2015-04-22
  Administered 2015-04-22: 40 mg via INTRAVENOUS
  Filled 2015-04-22: qty 4

## 2015-04-22 NOTE — Progress Notes (Signed)
2 Days Post-Op Procedure(s) (LRB): CORONARY ARTERY BYPASS GRAFTING (CABG) x 4 Using left internal mammary artery and saphenous vein. (N/A) TRANSESOPHAGEAL ECHOCARDIOGRAM (TEE) (N/A) BILATERAL SAPHENOUS VEIN HARVEST (Bilateral) Subjective: C/o incisional pain and nausea  Objective: Vital signs in last 24 hours: Temp:  [97.3 F (36.3 C)-99 F (37.2 C)] 98.2 F (36.8 C) (05/06 0400) Pulse Rate:  [66-82] 66 (05/06 0700) Cardiac Rhythm:  [-] Normal sinus rhythm (05/06 0400) Resp:  [13-21] 15 (05/06 0700) BP: (106-169)/(42-69) 113/52 mmHg (05/06 0700) SpO2:  [94 %-100 %] 96 % (05/06 0700) Arterial Line BP: (135-151)/(47-52) 151/51 mmHg (05/05 1200) Weight:  [138 lb 14.2 oz (63 kg)] 138 lb 14.2 oz (63 kg) (05/06 0500)  Hemodynamic parameters for last 24 hours: PAP: (28-33)/(13-16) 30/13 mmHg CO:  [3.4 L/min] 3.4 L/min CI:  [2.1 L/min/m2] 2.1 L/min/m2  Intake/Output from previous day: 05/05 0701 - 05/06 0700 In: 1210 [P.O.:180; I.V.:580; IV Piggyback:450] Out: 695 [Urine:595; Chest Tube:100] Intake/Output this shift:    General appearance: alert, cooperative and no distress Neurologic: intact Heart: regular rate and rhythm Lungs: diminished breath sounds bibasilar Abdomen: soft, nontender, hypoactive BS  Lab Results:  Recent Labs  04/21/15 1630 04/21/15 1631 04/22/15 0455  WBC 15.9*  --  14.0*  HGB 9.1* 9.9* 8.8*  HCT 27.6* 29.0* 26.4*  PLT 263  --  252   BMET:  Recent Labs  04/21/15 0355 04/21/15 1631 04/22/15 0455  NA 136 136 136  K 3.3* 4.7 4.4  CL 105 101 103  CO2 24  --  25  GLUCOSE 128* 126* 115*  BUN 6 13 19   CREATININE 0.62 1.00 1.14*  CALCIUM 8.2*  --  8.7*    PT/INR:  Recent Labs  04/20/15 1505  LABPROT 18.1*  INR 1.48   ABG    Component Value Date/Time   PHART 7.377 04/21/2015 0144   HCO3 22.0 04/21/2015 0144   TCO2 20 04/21/2015 1631   ACIDBASEDEF 3.0* 04/21/2015 0144   O2SAT 98.0 04/21/2015 0144   CBG (last 3)   Recent Labs  04/21/15 1922 04/21/15 2318 04/22/15 0408  GLUCAP 107* 130* 106*    Assessment/Plan: S/P Procedure(s) (LRB): CORONARY ARTERY BYPASS GRAFTING (CABG) x 4 Using left internal mammary artery and saphenous vein. (N/A) TRANSESOPHAGEAL ECHOCARDIOGRAM (TEE) (N/A) BILATERAL SAPHENOUS VEIN HARVEST (Bilateral) Plan for transfer to step-down: see transfer orders   CV- in SR, BP well controlled, but very low relative to her baseline  ASA, beta blocker, refuses statin  Consider adding ACE-I prior to DC  RESP - bibasilar atelectasis- IS  RENAL- lytes OK, creatinine minimally elevated  Weight up 10 pounds due to 3rd spacing  May be partly BP related  ENDO- CBG OK- change to AC/HS, dc standing insulin  Anemia secondary to ABL- mild, follow  Mobilize  COAG- DVT prophylaxis- SCD + enoxaparin  Family history of protein S deficiency- her levels are normal  Transfer to PTCU   LOS: 3 days    Loreli SlotSteven C Amar Keenum 04/22/2015

## 2015-04-22 NOTE — Discharge Summary (Signed)
Physician Discharge Summary  Patient ID: Alexis Moss MRN: [id number] DOB/AGE: 73-Feb-1942 73 y.o.  Admit date: 04/18/2015 Discharge date: 04/24/2015  Admission Diagnoses:  Patient Active Problem List   Diagnosis Date Noted  . 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 Hyperlipidemia 04/19/2015  . NSTEMI (non-ST elevated myocardial infarction) 04/14/2015  . Hypertension 04/14/2015  . Hypertensive urgency 04/14/2015  . Bilateral carotid bruits 04/14/2015   Discharge Diagnoses:   Patient Active Problem List   Diagnosis Date Noted  . 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 Hyperlipidemia 04/19/2015  . NSTEMI (non-ST elevated myocardial infarction) 04/14/2015  . Hypertension 04/14/2015  . Hypertensive urgency 04/14/2015  . Bilateral carotid bruits 04/14/2015    Discharged Condition: good  History of Present Illness:  Alexis Moss is a 74 yo white female with no previous history of CAD but recent diagnosis of HTN. 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 During that admission she admitted to several day headache with visual disturbances. She had also not eaten much on the days leading to admission because rather than take medication for hypertension she wished to try diet modification. In the ED her EKG showed SR with a RBBB. Cardiac enzymes were minimally elevated. She was evaluated by Dr. Anne Fu who recommended Cardiac catheterization, however the patient declined and wished to follow up on an outpatient basis and she was discharged home. However patient developed chest pain on 04/17/2015. Initially NTG provided relief, however her pain continued through 04/18/2015 and was unrelieved with NTG. Therefore she presented to the ED for evaluation. Her cardiac markers were minimally elevated, but were decreasing from previous ED visit. She was placed on  Heparin and Nitroglycerin and admitted for further care. The patient was agreeable to proceed with catheterization. This was done on 04/19/2015 which showed 3 vessel CAD with LM involvement. It was felt Coronary Artery Bypass Graft would be indicated and TCTS consult was requested.   Hospital Course:   The patient remained chest pain free during hospitalization.  She was evaluated by Dr. Dorris Fetch who was in agreement she would benefit from Coronary Artery Bypass Grafting procedure.  The risks and benefits of the procedure were explained to the patient and she was agreeable to proceed.  She was taken to the operating room on 04/20/2015.  She underwent CABG x 4 utilizing LIMA to LAD, SVG to Diagonal, SVG to OM, and SVG to distal RCA.  She also underwent Endoscopic Harvest of the Greater Saphenous vein harvest from his right and left thigh.  She tolerated the procedure well and was taken to the SICU in stable condition.  She was extubated the evening of surgery.  During her stay in the SICU she was weaned off all drips as tolerated.  Her chest tubes and arterial lines were removed without difficulty.  She is maintaining NSR.  She is ambulating without difficulty.  She was felt medically stable for transfer to the step down unit on POD #2. She is ambulating on room air. She is tolerating a diet and has had a bowel movement. Epicardial pacing wires and chest tube sutures were removed. She is felt surgically stable for discharge. She will be transferred to a SNF for short term convalescence.   Significant Diagnostic Studies: angiography:   Treatments: surgery:   Median sternotomy, extracorporeal circulation, coronary artery bypass grafting x4 (left internal mammary artery  to left anterior descending, saphenous vein graft to first diagonal, saphenous vein graft to obtuse marginal, saphenous vein graft to distal right coronary), endoscopic vein harvest, bilateral thighs.  Disposition:SNF  The patient has  been discharged on:   Medication List    STOP taking these medications        nitroGLYCERIN 0.4 MG SL tablet  Commonly known as:  NITROSTAT      TAKE these medications        amLODipine 5 MG tablet  Commonly known as:  NORVASC  Take 1 tablet (5 mg total) by mouth daily.     aspirin 325 MG EC tablet  Take 1 tablet (325 mg total) by mouth daily.     atorvastatin 20 MG tablet  Commonly known as:  LIPITOR  Take 1 tablet (20 mg total) by mouth daily at 6 PM.     carvedilol 6.25 MG tablet  Commonly known as:  COREG  Take 1 tablet (6.25 mg total) by mouth 2 (two) times daily with a meal.     Coconut Oil Oil  Apply 1 application topically daily.     ferrous sulfate 325 (65 FE) MG tablet  Take 1 tablet (325 mg total) by mouth daily with breakfast.     folic acid 1 MG tablet  Commonly known as:  FOLVITE  Take 1 tablet (1 mg total) by mouth daily.     lisinopril 20 MG tablet  Commonly known as:  PRINIVIL,ZESTRIL  Take 1 tablet (20 mg total) by mouth daily.     MELATONIN PO  Take 1 tablet by mouth daily as needed. For sleep per patient     multivitamin with minerals Tabs tablet  Take 1 tablet by mouth daily.     Potassium 99 MG Tabs  Take 1 tablet by mouth daily.     traMADol 50 MG tablet  Commonly known as:  ULTRAM  Take 1-2 tablets (50-100 mg total) by mouth every 6 (six) hours as needed for moderate pain.     vitamin C 1000 MG tablet  Take 1,000 mg by mouth daily.     VITAMIN D PO  Take 1 tablet by mouth daily.        1.Beta Blocker:  Yes [ x  ]                              No   [   ]                              If No, reason:  2.Ace Inhibitor/ARB: Yes [ x  ]                                     No  [    ]                                     If No, reason:  3.Statin:   Yes [  x ]                  No  [   ]                  If No, reason:  4.Ecasa:  Yes  [x   ]                  No   [   ]                  If No, reason:    Follow-up Information     Follow up with Loreli SlotSteven C Niana Martorana, MD On 05/17/2015.   Specialty:  Cardiothoracic Surgery   Why:  Appoointment is at 1:00   Contact information:   [address] [place] [address] [phone]       Follow up with [facility] On 05/17/2015.   Why:  Please get CXR at 12:30   Contact information:   [facility]       Follow up with Donato SchultzSKAINS, MARK, MD On 05/02/2015.   Specialty:  Cardiology   Why:  Appointment is at 3:45   Contact information:   [address] [place] [address] [phone]        Follow-up Information    Follow up with Loreli SlotSteven C Syler Norcia, MD On 05/17/2015.   Specialty:  Cardiothoracic Surgery   Why:  Appoointment is at 1:00   Contact information:   [address] [place] [address] [phone]       Follow up with [facility] On 05/17/2015.   Why:  Please get CXR at 12:30   Contact information:   [facility]       Follow up with Donato SchultzSKAINS, MARK, MD On 05/02/2015.   Specialty:  Cardiology   Why:  Appointment is at 3:45   Contact information:   [address] [facility] [place] [address] [phone]       Signed: Ardelle BallsZIMMERMAN,DONIELLE M 04/24/2015, 11:10 AM

## 2015-04-22 NOTE — Progress Notes (Signed)
TCTS BRIEF SICU PROGRESS NOTE  2 Days Post-Op  S/P Procedure(s) (LRB): CORONARY ARTERY BYPASS GRAFTING (CABG) x 4 Using left internal mammary artery and saphenous vein. (N/A) TRANSESOPHAGEAL ECHOCARDIOGRAM (TEE) (N/A) BILATERAL SAPHENOUS VEIN HARVEST (Bilateral)   Stable day although patient resistant to cooperate with routine care, doesn't want to take medications NSR w/ stable BP O2 sats 95% RA UOP adequate  Plan: Continue routine postop care  Alexis Moss 04/22/2015 6:52 PM

## 2015-04-22 NOTE — Progress Notes (Signed)
      [address]       [place] [id number]             [phone]      Resistant to RN but has ambulated twice today  Only pulling about 550 ml on IS  BP 148/62 mmHg  Pulse 74  Temp(Src) 98.5 F (36.9 C) (Oral)  Resp 20  Ht 5\' 4"  (1.626 m)  Wt 138 lb 14.2 oz (63 kg)  BMI 23.83 kg/m2  SpO2 95%   Intake/Output Summary (Last 24 hours) at 04/22/15 1709 Last data filed at 04/22/15 1400  Gross per 24 hour  Intake 1082.5 ml  Output    725 ml  Net  357.5 ml    I had a long discussion with her regarding statin medications. She refuses to take a statin. I explained the reasons for ordering- hyperlipidemia with markedly elevated LDL and severe ASCVD including CAD, ECCOD and PAD. I explained the known benefits of statins. I also discussed her fears- liver damage and myalgias, explaining that we could stop the medication if she had signs of either. She still refuses, saying she has heard bad things. Atorvastatin dc'ed at patient request.  Viviann SpareSteven C. Dorris FetchHendrickson, MD [facility] [phone]

## 2015-04-22 NOTE — Progress Notes (Addendum)
Notified Dr. Donata ClayVan Trigt of continued decreased UOP, currently 15 ml/hr x 3 hours.  Orders received, also given order to hold am dose of metoprolol.  Roselie AwkwardShannon Mannie Wineland, RN

## 2015-04-23 ENCOUNTER — Inpatient Hospital Stay (HOSPITAL_COMMUNITY): Payer: Medicare Other

## 2015-04-23 LAB — GLUCOSE, CAPILLARY
GLUCOSE-CAPILLARY: 98 mg/dL (ref 70–99)
Glucose-Capillary: 106 mg/dL — ABNORMAL HIGH (ref 70–99)
Glucose-Capillary: 116 mg/dL — ABNORMAL HIGH (ref 70–99)
Glucose-Capillary: 122 mg/dL — ABNORMAL HIGH (ref 70–99)
Glucose-Capillary: 98 mg/dL (ref 70–99)

## 2015-04-23 LAB — CBC
HCT: 25 % — ABNORMAL LOW (ref 36.0–46.0)
Hemoglobin: 8.3 g/dL — ABNORMAL LOW (ref 12.0–15.0)
MCH: 27.6 pg (ref 26.0–34.0)
MCHC: 33.2 g/dL (ref 30.0–36.0)
MCV: 83.1 fL (ref 78.0–100.0)
PLATELETS: 214 10*3/uL (ref 150–400)
RBC: 3.01 MIL/uL — ABNORMAL LOW (ref 3.87–5.11)
RDW: 14.2 % (ref 11.5–15.5)
WBC: 10.6 10*3/uL — AB (ref 4.0–10.5)

## 2015-04-23 LAB — TYPE AND SCREEN
ABO/RH(D): A POS
Antibody Screen: NEGATIVE
UNIT DIVISION: 0
Unit division: 0
Unit division: 0
Unit division: 0

## 2015-04-23 LAB — BASIC METABOLIC PANEL
ANION GAP: 11 (ref 5–15)
BUN: 22 mg/dL — ABNORMAL HIGH (ref 6–20)
CALCIUM: 8.3 mg/dL — AB (ref 8.9–10.3)
CO2: 21 mmol/L — AB (ref 22–32)
Chloride: 103 mmol/L (ref 101–111)
Creatinine, Ser: 0.98 mg/dL (ref 0.44–1.00)
GFR calc Af Amer: >60 mL/min (ref >60)
GFR, EST NON AFRICAN AMERICAN: 56 mL/min — AB (ref >60)
GLUCOSE: 104 mg/dL — AB (ref 70–99)
Potassium: 3.8 mmol/L (ref 3.5–5.1)
Sodium: 135 mmol/L (ref 135–145)

## 2015-04-23 MED ORDER — POTASSIUM CHLORIDE CRYS ER 20 MEQ PO TBCR
40.0000 meq | EXTENDED_RELEASE_TABLET | Freq: Once | ORAL | Status: AC
Start: 2015-04-23 — End: 2015-04-23
  Administered 2015-04-23: 40 meq via ORAL
  Filled 2015-04-23: qty 2

## 2015-04-23 MED ORDER — AMLODIPINE BESYLATE 5 MG PO TABS
5.0000 mg | ORAL_TABLET | Freq: Every day | ORAL | Status: DC
  Administered 2015-04-23 – 2015-04-26 (×4): 5 mg via ORAL
  Filled 2015-04-23 (×5): qty 1

## 2015-04-23 MED ORDER — LISINOPRIL 10 MG PO TABS
10.0000 mg | ORAL_TABLET | Freq: Every day | ORAL | Status: DC
  Administered 2015-04-23 – 2015-04-25 (×3): 10 mg via ORAL
  Filled 2015-04-23 (×5): qty 1

## 2015-04-23 NOTE — Progress Notes (Signed)
[facility]; Pt ambulated to 2W13 without difficulty.

## 2015-04-23 NOTE — Progress Notes (Signed)
Pt tx from 2S, Alert x4, no c/o pain.  Ambulated around bedside with no SOB or difficulty.  Family/Friends present at the bedside.  Report taken from [facility] RN 2S.  Will continue to monitor.

## 2015-04-23 NOTE — Progress Notes (Signed)
      [address]       [place] [id number]             [phone]        CARDIOTHORACIC SURGERY PROGRESS NOTE   R3 Days Post-Op Procedure(s) (LRB): CORONARY ARTERY BYPASS GRAFTING (CABG) x 4 Using left internal mammary artery and saphenous vein. (N/A) TRANSESOPHAGEAL ECHOCARDIOGRAM (TEE) (N/A) BILATERAL SAPHENOUS VEIN HARVEST (Bilateral)  Subjective: Feels a little better.  Denies pain or SOB.  Ate 1/2 of breakfast.  Ambulating some.  Objective: Vital signs: BP Readings from Last 1 Encounters:  04/23/15 158/62   Pulse Readings from Last 1 Encounters:  04/23/15 81   Resp Readings from Last 1 Encounters:  04/23/15 19   Temp Readings from Last 1 Encounters:  04/23/15 98.3 F (36.8 C) Oral    Hemodynamics:    Physical Exam:  Rhythm:   sinus  Breath sounds: clear  Heart sounds:  RRR  Incisions:  Clean and dry  Abdomen:  Soft, non-distended, non-tender  Extremities:  Warm, well-perfused    Intake/Output from previous day: 05/06 0701 - 05/07 0700 In: 802.5 [P.O.:720; I.V.:82.5] Out: 1150 [Urine:1150] Intake/Output this shift: Total I/O In: 240 [P.O.:240] Out: -   Lab Results:  CBC: Recent Labs  04/22/15 0455 04/23/15 0305  WBC 14.0* 10.6*  HGB 8.8* 8.3*  HCT 26.4* 25.0*  PLT 252 214    BMET:  Recent Labs  04/22/15 0455 04/23/15 0305  NA 136 135  K 4.4 3.8  CL 103 103  CO2 25 21*  GLUCOSE 115* 104*  BUN 19 22*  CREATININE 1.14* 0.98  CALCIUM 8.7* 8.3*     CBG (last 3)   Recent Labs  04/22/15 1650 04/23/15 0020 04/23/15 0753  GLUCAP 121* 98 98    ABG    Component Value Date/Time   PHART 7.377 04/21/2015 0144   PCO2ART 37.5 04/21/2015 0144   PO2ART 106.0* 04/21/2015 0144   HCO3 22.0 04/21/2015 0144   TCO2 20 04/21/2015 1631   ACIDBASEDEF 3.0* 04/21/2015 0144   O2SAT 98.0 04/21/2015 0144    CXR: CHEST 2 VIEW  COMPARISON: 04/22/2015  FINDINGS: The right jugular sheath has been removed. There is no  pneumothorax. There is mild atelectatic appearing linear opacity in the left base, unchanged.  IMPRESSION: No pneumothorax. Mild unchanged left base atelectasis.   Electronically Signed  By: Ellery Plunkaniel R Mitchell M.D.  On: 04/23/2015 06:00  Assessment/Plan: S/P Procedure(s) (LRB): CORONARY ARTERY BYPASS GRAFTING (CABG) x 4 Using left internal mammary artery and saphenous vein. (N/A) TRANSESOPHAGEAL ECHOCARDIOGRAM (TEE) (N/A) BILATERAL SAPHENOUS VEIN HARVEST (Bilateral)  Overall doing well POD3 Maintaining NSR w/ stable BP, somewhat hypertensive Expected post op acute blood loss anemia, stable Expected post op volume excess, mild, diuresing Expected post op atelectasis, mild   Restart low dose Norvasc and Lisinopril  Mobilize  Gentle diuresis  Watch anemia  Transfer 2W   Purcell NailsClarence H Lakeeta Dobosz 04/23/2015 10:45 AM

## 2015-04-24 LAB — BASIC METABOLIC PANEL
Anion gap: 11 (ref 5–15)
BUN: 17 mg/dL (ref 6–20)
CO2: 24 mmol/L (ref 22–32)
CREATININE: 0.78 mg/dL (ref 0.44–1.00)
Calcium: 8.4 mg/dL — ABNORMAL LOW (ref 8.9–10.3)
Chloride: 101 mmol/L (ref 101–111)
GFR calc Af Amer: >60 mL/min (ref >60)
GLUCOSE: 99 mg/dL (ref 70–99)
Potassium: 3.6 mmol/L (ref 3.5–5.1)
SODIUM: 136 mmol/L (ref 135–145)

## 2015-04-24 LAB — CBC
HCT: 25.1 % — ABNORMAL LOW (ref 36.0–46.0)
Hemoglobin: 8.3 g/dL — ABNORMAL LOW (ref 12.0–15.0)
MCH: 27.6 pg (ref 26.0–34.0)
MCHC: 33.1 g/dL (ref 30.0–36.0)
MCV: 83.4 fL (ref 78.0–100.0)
PLATELETS: 287 10*3/uL (ref 150–400)
RBC: 3.01 MIL/uL — ABNORMAL LOW (ref 3.87–5.11)
RDW: 14.3 % (ref 11.5–15.5)
WBC: 8.6 10*3/uL (ref 4.0–10.5)

## 2015-04-24 LAB — GLUCOSE, CAPILLARY: GLUCOSE-CAPILLARY: 100 mg/dL — AB (ref 70–99)

## 2015-04-24 MED ORDER — FERROUS SULFATE 325 (65 FE) MG PO TABS
325.0000 mg | ORAL_TABLET | Freq: Every day | ORAL | Status: DC
  Administered 2015-04-24 – 2015-04-26 (×3): 325 mg via ORAL
  Filled 2015-04-24 (×4): qty 1

## 2015-04-24 MED ORDER — FOLIC ACID 1 MG PO TABS
1.0000 mg | ORAL_TABLET | Freq: Every day | ORAL | Status: DC
  Administered 2015-04-24 – 2015-04-26 (×3): 1 mg via ORAL
  Filled 2015-04-24 (×4): qty 1

## 2015-04-24 MED ORDER — POTASSIUM CHLORIDE CRYS ER 20 MEQ PO TBCR
40.0000 meq | EXTENDED_RELEASE_TABLET | Freq: Once | ORAL | Status: AC
  Administered 2015-04-24: 40 meq via ORAL
  Filled 2015-04-24: qty 2

## 2015-04-24 MED ORDER — CARVEDILOL 6.25 MG PO TABS
6.2500 mg | ORAL_TABLET | Freq: Two times a day (BID) | ORAL | Status: DC
  Administered 2015-04-24 – 2015-04-26 (×5): 6.25 mg via ORAL
  Filled 2015-04-24 (×7): qty 1

## 2015-04-24 NOTE — Progress Notes (Addendum)
      [address]       [place] [id number]             [phone]        4 Days Post-Op Procedure(s) (LRB): CORONARY ARTERY BYPASS GRAFTING (CABG) x 4 Using left internal mammary artery and saphenous vein. (N/A) TRANSESOPHAGEAL ECHOCARDIOGRAM (TEE) (N/A) BILATERAL SAPHENOUS VEIN HARVEST (Bilateral)  Subjective: Patient has no specific complaints except being tired.  Objective: Vital signs in last 24 hours: Temp:  [98.2 F (36.8 C)-99.3 F (37.4 C)] 98.2 F (36.8 C) (05/08 0614) Pulse Rate:  [89-96] 92 (05/08 0614) Cardiac Rhythm:  [-] Normal sinus rhythm (05/07 2326) Resp:  [18-23] 18 (05/08 0614) BP: (138-175)/(53-67) 163/61 mmHg (05/08 0614) SpO2:  [91 %-94 %] 91 % (05/08 0614) Weight:  [133 lb 6.1 oz (60.5 kg)] 133 lb 6.1 oz (60.5 kg) (05/08 0614)  Pre op weight 57.9 kg Current Weight  04/24/15 133 lb 6.1 oz (60.5 kg)       Intake/Output from previous day: 05/07 0701 - 05/08 0700 In: 823 [P.O.:820; I.V.:3] Out: 600 [Urine:600]   Physical Exam:  Cardiovascular: RRR Pulmonary: Clear to auscultation bilaterally; no rales, wheezes, or rhonchi. Abdomen: Soft, non tender, bowel sounds present. Extremities: Mild bilateral lower extremity edema. Wounds: Clean and dry.  No erythema or signs of infection.  Lab Results: CBC: Recent Labs  04/23/15 0305 04/24/15 0445  WBC 10.6* 8.6  HGB 8.3* 8.3*  HCT 25.0* 25.1*  PLT 214 287   BMET:  Recent Labs  04/23/15 0305 04/24/15 0445  NA 135 136  K 3.8 3.6  CL 103 101  CO2 21* 24  GLUCOSE 104* 99  BUN 22* 17  CREATININE 0.98 0.78  CALCIUM 8.3* 8.4*    PT/INR:  Lab Results  Component Value Date   INR 1.48 04/20/2015   INR 1.09 04/19/2015   ABG:  INR: Will add last result for INR, ABG once components are confirmed Will add last 4 CBG results once components are confirmed  Assessment/Plan:  1. CV - SR in the 90's. On Coreg 3.125 mg bid, Norvasc 5 mg daily, and Lisinopril 10 mg  daily. Was on Coreg 6.25 mg bid pre op. Will increase to this for better BP control. 2.  Pulmonary - On room air. Encourage incentive spirometer. 3. Volume Overload - On Lasix 40 mg daily. 4.  Acute blood loss anemia - H and H stable at 8.3 and 25.1. Start Ferrous and folic acid daily. 5. Supplement potassium 6. Remove EPW 7. CBGs 116/122/100. Pre op HGA1C 5.9. Is pre diabetic. Stop accu checks and SS PRN. 8. Possible discharge in am  ZIMMERMAN,DONIELLE MPA-C 04/24/2015,8:13 AM  I have seen and examined the patient and agree with the assessment and plan as outlined.  Looks good and potentially ready for d/c home in 1-2 days, although patient doesn't want to go tomorrow because her husband is selling a truck.  Purcell NailsClarence H Gunner Iodice 04/24/2015 10:40 AM

## 2015-04-24 NOTE — Progress Notes (Signed)
EPW d/c at this time per MD order; pt tolerated well; bedrest until 1205; will cont. To monitor.

## 2015-04-25 DIAGNOSIS — Z951 Presence of aortocoronary bypass graft: Secondary | ICD-10-CM

## 2015-04-25 LAB — GLUCOSE, CAPILLARY: Glucose-Capillary: 89 mg/dL (ref 70–99)

## 2015-04-25 MED ORDER — TRAMADOL HCL 50 MG PO TABS: 50.0000 mg | ORAL_TABLET | Freq: Four times a day (QID) | ORAL | Status: DC | PRN

## 2015-04-25 MED ORDER — FOLIC ACID 1 MG PO TABS: 1.0000 mg | ORAL_TABLET | Freq: Every day | ORAL | Status: DC

## 2015-04-25 MED ORDER — ASPIRIN 325 MG PO TBEC: 325.0000 mg | DELAYED_RELEASE_TABLET | Freq: Every day | ORAL | Status: DC

## 2015-04-25 MED ORDER — FERROUS SULFATE 325 (65 FE) MG PO TABS: 325.0000 mg | ORAL_TABLET | Freq: Every day | ORAL | Status: DC

## 2015-04-25 NOTE — Progress Notes (Signed)
   Cardiologist: Anne FuSkains  Subjective:  Walking hallway, improved. Mild SOB.  I had seen her on original visit to hospital and unfortunately had refused heart cath originally. 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  Objective:  Vital Signs in the last 24 hours: Temp:  [97.3 F (36.3 C)-98.4 F (36.9 C)] 97.3 F (36.3 C) (05/09 0943) Pulse Rate:  [78-89] 85 (05/09 0301) Resp:  [18] 18 (05/09 0943) BP: (139-171)/(49-70) 154/61 mmHg (05/09 0943) SpO2:  [95 %-98 %] 97 % (05/09 0943) Weight:  [130 lb 12.8 oz (59.33 kg)] 130 lb 12.8 oz (59.33 kg) (05/09 0301)  Intake/Output from previous day: 05/08 0701 - 05/09 0700 In: 918 [P.O.:918] Out: 350 [Urine:350]   Physical Exam: General: Well developed, well nourished, in no acute distress. Head:  Normocephalic and atraumatic. Lungs: Clear to auscultation and percussion. Heart: Normal S1 and S2.  No murmur, rubs or gallops.  Abdomen: soft, non-tender, positive bowel sounds. Extremities: No clubbing or cyanosis. No edema. Neurologic: Alert and oriented x 3.    Lab Results:  Recent Labs  04/23/15 0305 04/24/15 0445  WBC 10.6* 8.6  HGB 8.3* 8.3*  PLT 214 287    Recent Labs  04/23/15 0305 04/24/15 0445  NA 135 136  K 3.8 3.6  CL 103 101  CO2 21* 24  GLUCOSE 104* 99  BUN 22* 17  CREATININE 0.98 0.78   Telemetry: NSR Personally viewed.   Scheduled Meds: . acetaminophen  1,000 mg Oral 4 times per day  . amLODipine  5 mg Oral Daily  . aspirin EC  325 mg Oral Daily  . bisacodyl  10 mg Oral Daily   Or  . bisacodyl  10 mg Rectal Daily  . carvedilol  6.25 mg Oral BID WC  . cholecalciferol  1,000 Units Oral Daily  . docusate sodium  200 mg Oral Daily  . enoxaparin (LOVENOX) injection  40 mg Subcutaneous QHS  . ferrous sulfate  325 mg Oral Q breakfast  . folic acid  1 mg Oral Daily  . furosemide  40 mg Oral Daily  . lisinopril  10 mg Oral Daily  . pantoprazole  40 mg Oral  Daily  . sodium chloride  3 mL Intravenous Q12H  . vitamin C  1,000 mg Oral Daily   Continuous Infusions:  PRN Meds:.sodium chloride, ALPRAZolam, alum & mag hydroxide-simeth, guaiFENesin, hydrALAZINE, labetalol, magnesium hydroxide, ondansetron (ZOFRAN) IV, sodium chloride, traMADol, zolpidem   Assessment/Plan:  Principal Problem:   NSTEMI (non-ST elevated myocardial infarction) Active Problems:   Hypertension   Hypertensive urgency   Bilateral carotid bruits   Hyperlipidemia   S/P CABG x 4  -Improved. May be able to DC.  -Meds reviewed.  -Has refused statins in the past. Would strongly encourage.  -Will see in follow up.  -Encourage her establishing PCP.   Alexis Moss 04/25/2015, 9:57 AM

## 2015-04-25 NOTE — Progress Notes (Signed)
Sutures removed py MD order, pt tolerated the procedure well, will continue to monitor pt.

## 2015-04-25 NOTE — Discharge Instructions (Signed)
Coronary Artery Bypass Grafting, Care After Refer to this sheet in the next few weeks. These instructions provide you with information on caring for yourself after your procedure. Your health care provider may also give you more specific instructions. Your treatment has been planned according to current medical practices, but problems sometimes occur. Call your health care provider if you have any problems or questions after your procedure. WHAT TO EXPECT AFTER THE PROCEDURE Recovery from surgery will be different for everyone. Some people feel well after 3 or 4 weeks, while for others it takes longer. After your procedure, it is typical to have the following:  Nausea and a lack of appetite.   Constipation.  Weakness and fatigue.   Depression or irritability.   Pain or discomfort at your incision site. HOME CARE INSTRUCTIONS  Take medicines only as directed by your health care provider. Do not stop taking medicines or start any new medicines without first checking with your health care provider.  Take your pulse as directed by your health care provider.  Perform deep breathing as directed by your health care provider. If you were given a device called an incentive spirometer, use it to practice deep breathing several times a day. Support your chest with a pillow or your arms when you take deep breaths or cough.  Keep incision areas clean, dry, and protected. Remove or change any bandages (dressings) only as directed by your health care provider. You may have skin adhesive strips over the incision areas. Do not take the strips off. They will fall off on their own.  Check incision areas daily for any swelling, redness, or drainage.  If incisions were made in your legs, do the following:  Avoid crossing your legs.   Avoid sitting for long periods of time. Change positions every 30 minutes.   Elevate your legs when you are sitting.  Wear compression stockings as directed by your  health care provider. These stockings help keep blood clots from forming in your legs.  Take showers once your health care provider approves. Until then, only take sponge baths. Pat incisions dry. Do not rub incisions with a washcloth or towel. Do not take baths, swim, or use a hot tub until your health care provider approves.  Eat foods that are high in fiber, such as raw fruits and vegetables, whole grains, beans, and nuts. Meats should be lean cut. Avoid canned, processed, and fried foods.  Drink enough fluid to keep your urine clear or pale yellow.  Weigh yourself every day. This helps identify if you are retaining fluid that may make your heart and lungs work harder.  Rest and limit activity as directed by your health care provider. You may be instructed to:  Stop any activity at once if you have chest pain, shortness of breath, irregular heartbeats, or dizziness. Get help right away if you have any of these symptoms.  Move around frequently for short periods or take short walks as directed by your health care provider. Increase your activities gradually. You may need physical therapy or cardiac rehabilitation to help strengthen your muscles and build your endurance.  Avoid lifting, pushing, or pulling anything heavier than 10 lb (4.5 kg) for at least 6 weeks after surgery.  Do not drive until your health care provider approves.  Ask your health care provider when you may return to work.  Ask your health care provider when you may resume sexual activity.  Keep all follow-up visits as directed by your health care  provider. This is important. °SEEK MEDICAL CARE IF: °· You have swelling, redness, increasing pain, or drainage at the site of an incision. °· You have a fever. °· You have swelling in your ankles or legs. °· You have pain in your legs.   °· You gain 2 or more pounds (0.9 kg) a day. °· You are nauseous or vomit. °· You have diarrhea.  °SEEK IMMEDIATE MEDICAL CARE IF: °· You have  chest pain that goes to your jaw or arms. °· You have shortness of breath.   °· You have a fast or irregular heartbeat.   °· You notice a "clicking" in your breastbone (sternum) when you move.   °· You have numbness or weakness in your arms or legs. °· You feel dizzy or light-headed.   °MAKE SURE YOU: °· Understand these instructions. °· Will watch your condition. °· Will get help right away if you are not doing well or get worse. °Document Released: 06/22/2005 Document Revised: 04/19/2014 Document Reviewed: 05/12/2013 °ExitCare® Patient Information ©2015 [employer]. This information is not intended to replace advice given to you by your health care provider. Make sure you discuss any questions you have with your health care provider. ° °Endoscopic Saphenous Vein Harvesting °Care After °Refer to this sheet in the next few weeks. These instructions provide you with information on caring for yourself after your procedure. Your health care provider may also give you more specific instructions. Your treatment has been planned according to current medical practices, but problems sometimes occur. Call your health care provider if you have any problems or questions after your procedure. °HOME CARE INSTRUCTIONS °Medicine °· Take whatever pain medicine your surgeon prescribes. Follow the directions carefully. Do not take over-the-counter pain medicine unless your surgeon says it is okay. Some pain medicine can cause bleeding problems for several weeks after surgery. °· Follow your surgeon's instructions about driving. You will probably not be permitted to drive after heart surgery. °· Take any medicines your surgeon prescribes. Any medicines you took before your heart surgery should be checked with your health care provider before you start taking them again. °Wound care °· If your surgeon has prescribed an elastic bandage or stocking, ask how long you should wear it. °· Check the area around your surgical cuts  (incisions) whenever your bandages (dressings) are changed. Look for any redness or swelling. °· You will need to return to have the stitches (sutures) or staples taken out. Ask your surgeon when to do that. °· Ask your surgeon when you can shower or bathe. °Activity °· Try to keep your legs raised when you are sitting. °· Do any exercises your health care providers have given you. These may include deep breathing exercises, coughing, walking, or other exercises. °SEEK MEDICAL CARE IF: °· You have any questions about your medicines. °· You have more leg pain, especially if your pain medicine stops working. °· New or growing bruises develop on your leg. °· Your leg swells, feels tight, or becomes red. °· You have numbness in your leg. °SEEK IMMEDIATE MEDICAL CARE IF: °· Your pain gets much worse. °· Blood or fluid leaks from any of the incisions. °· Your incisions become warm, swollen, or red. °· You have chest pain. °· You have trouble breathing. °· You have a fever. °· You have more pain near your leg incision. °MAKE SURE YOU: °· Understand these instructions. °· Will watch your condition. °· Will get help right away if you are not doing well or get worse. °Document Released: 08/15/2011   Document Revised: 12/08/2013 Document Reviewed: 08/15/2011 [facility] Patient Information 2015 [place], [place]. This information is not intended to replace advice given to you by your health care provider. Make sure you discuss any questions you have with your health care provider.  Endoscopic Saphenous Vein Harvesting Care After Refer to this sheet in the next few weeks. These instructions provide you with information on caring for yourself after your procedure. Your health care provider may also give you more specific instructions. Your treatment has been planned according to current medical practices, but problems sometimes occur. Call your health care provider if you have any problems or questions after your procedure. HOME  CARE INSTRUCTIONS Medicine Take whatever pain medicine your surgeon prescribes. Follow the directions carefully. Do not take over-the-counter pain medicine unless your surgeon says it is okay. Some pain medicine can cause bleeding problems for several weeks after surgery. Follow your surgeon's instructions about driving. You will probably not be permitted to drive after heart surgery. Take any medicines your surgeon prescribes. Any medicines you took before your heart surgery should be checked with your health care provider before you start taking them again. Wound care If your surgeon has prescribed an elastic bandage or stocking, ask how long you should wear it. Check the area around your surgical cuts (incisions) whenever your bandages (dressings) are changed. Look for any redness or swelling. You will need to return to have the stitches (sutures) or staples taken out. Ask your surgeon when to do that. Ask your surgeon when you can shower or bathe. Activity Try to keep your legs raised when you are sitting. Do any exercises your health care providers have given you. These may include deep breathing exercises, coughing, walking, or other exercises. SEEK MEDICAL CARE IF: You have any questions about your medicines. You have more leg pain, especially if your pain medicine stops working. New or growing bruises develop on your leg. Your leg swells, feels tight, or becomes red. You have numbness in your leg. SEEK IMMEDIATE MEDICAL CARE IF: Your pain gets much worse. Blood or fluid leaks from any of the incisions. Your incisions become warm, swollen, or red. You have chest pain. You have trouble breathing. You have a fever. You have more pain near your leg incision. MAKE SURE YOU: Understand these instructions. Will watch your condition. Will get help right away if you are not doing well or get worse. Document Released: 08/15/2011 Document Revised: 12/08/2013 Document Reviewed:  08/15/2011 [facility] Patient Information 2015 [employer]Care, [place]. This information is not intended to replace advice given to you by your health care provider. Make sure you discuss any questions you have with your health care provider.

## 2015-04-25 NOTE — Evaluation (Signed)
Physical Therapy Evaluation Patient Details Name: Alexis Moss MRN: [id number] DOB: 07/02/1941 Today's Date: 04/25/2015   History of Present Illness  Patient is a 74 y/o female admitted for second NSTEMI in 1 week s/p CABG x4 on 5/4. Pt recently discharged from hospital 4/29 for NSTEMI. PMH of HTN.    Clinical Impression  Patient presents with weakness and balance deficits s/p CABG impacting safe mobility. Pt with 2 LOB during gait training requiring assist to prevent fall and Min A to negotiate steps safely. Pt high fall risk and will not have 24/7 S at home at discharge. Pt independent and active PTA. Would benefit from ST SNF to improve gait, balance, endurance and mobility so pt can maximize independence, minimize fall risk and return to PLOF.    Follow Up Recommendations SNF;Supervision/Assistance - 24 hour    Equipment Recommendations  Other (comment) (TBD pending disposition)    Recommendations for Other Services       Precautions / Restrictions Precautions Precautions: Sternal;Fall Restrictions Weight Bearing Restrictions: No      Mobility  Bed Mobility               General bed mobility comments: Sitting in chair upon PT arrival.   Transfers Overall transfer level: Needs assistance Equipment used: None Transfers: Sit to/from Stand Sit to Stand: Min guard;Min assist         General transfer comment: Min guard to rise from chair, Min A to rise from low toilet using heart pillow for bracing. Multiple attempts to obtain standing position using body momentum. Good demo of sternal precautions.  Ambulation/Gait Ambulation/Gait assistance: Min assist Ambulation Distance (Feet): 120 Feet Assistive device: Rolling walker (2 wheeled);None Gait Pattern/deviations: Step-through pattern;Decreased stride length;Narrow base of support;Scissoring;Drifts right/left Gait velocity: very slow. Gait velocity interpretation: Below normal speed for age/gender General Gait  Details: Ambulated with and without RW- 2 LOB requiring Min A to prevent fall with and without AD. Unsteady with slow gait. Cues to keep both hands on walker handles. Vitals stable. HR increased to 103 bpm from 90.  Stairs Stairs: Yes Stairs assistance: Min assist Stair Management: Step to pattern;Sideways;Forwards;No rails Number of Stairs: 4 General stair comments: Handheld assist as pt does not have rails at home. Min A to ascend/descend steps. Sideways to descend.  Wheelchair Mobility    Modified Rankin (Stroke Patients Only)       Balance Overall balance assessment: Needs assistance Sitting-balance support: Feet supported;No upper extremity supported Sitting balance-Leahy Scale: Good Sitting balance - Comments: Able to doff socks without assist.    Standing balance support: During functional activity Standing balance-Leahy Scale: Fair                               Pertinent Vitals/Pain Pain Assessment: No/denies pain    Home Living Family/patient expects to be discharged to:: Skilled nursing facility Living Arrangements: Spouse/significant other Available Help at Discharge: Family;Available PRN/intermittently Type of Home: House Home Access: Stairs to enter Entrance Stairs-Rails: None Entrance Stairs-Number of Steps: 5 Home Layout: One level Home Equipment: None      Prior Function Level of Independence: Independent               Hand Dominance        Extremity/Trunk Assessment   Upper Extremity Assessment: Defer to OT evaluation           Lower Extremity Assessment: Generalized weakness  Communication   Communication: No difficulties  Cognition Arousal/Alertness: Awake/alert Behavior During Therapy: WFL for tasks assessed/performed Overall Cognitive Status: Within Functional Limits for tasks assessed                      General Comments General comments (skin integrity, edema, etc.): Pt's husband present  during session.    Exercises        Assessment/Plan    PT Assessment Patient needs continued PT services  PT Diagnosis Difficulty walking;Generalized weakness   PT Problem List Decreased strength;Decreased activity tolerance;Decreased balance;Decreased mobility;Decreased knowledge of use of DME  PT Treatment Interventions Balance training;Gait training;Stair training;Patient/family education;Functional mobility training;Therapeutic activities;Therapeutic exercise;DME instruction   PT Goals (Current goals can be found in the Care Plan section) Acute Rehab PT Goals Patient Stated Goal: to go to rehab PT Goal Formulation: With patient Time For Goal Achievement: 05/09/15 Potential to Achieve Goals: Good    Frequency Min 3X/week   Barriers to discharge Decreased caregiver support Pt does not have 24/7 S at home. Concerned about safety.    Co-evaluation               End of Session Equipment Utilized During Treatment: Gait belt Activity Tolerance: Patient tolerated treatment well Patient left: in chair;with call bell/phone within reach;with family/visitor present Nurse Communication: Mobility status         Time: 1441-1506 PT Time Calculation (min) (ACUTE ONLY): 25 min   Charges:   PT Evaluation $Initial PT Evaluation Tier I: 1 Procedure PT Treatments $Gait Training: 8-22 mins   PT G CodesAlvie Heidelberg:        Folan, Litha Lamartina A 04/25/2015, 3:22 PM  Mylo RedShauna Jeslynn Hollander, PT, DPT [phone]

## 2015-04-25 NOTE — Progress Notes (Signed)
CARDIAC REHAB PHASE I   PRE:  Rate/Rhythm: 81 SR    BP: sitting 154/61    SaO2: 96 RA  MODE:  Ambulation: 350 ft   POST:  Rate/Rhythm: 98 SR    BP: sitting 167/54     SaO2: 96 RA  Pt able to stand and walk with min assist. Slightly unsteady without RW (she declined using it). Sts she would be better with her shoes. Pt feels weak after walking. Sts she is concerned about going home, sts her husband will not provide 24/7 supervision and that he will not let her other family come and see her. She would prefer SNF. Will notify staff.  [phone]  [name], Alexis Moss [name] CES, ACSM 04/25/2015 10:09 AM

## 2015-04-25 NOTE — Progress Notes (Signed)
PATIENT C/O BILATERAL FOOT TINGLING AND RIGHT FOOT PAIN. STRENGTH EQUAL BILATERALLY IN ARMS AND LEGS. NO OTHER SYMPTOMS NOTED.  CARDIOTHORACIC ON-CALL PAGED.

## 2015-04-25 NOTE — Progress Notes (Addendum)
[address]       [place] [id number]             [phone]      5 Days Post-Op Procedure(s) (LRB): CORONARY ARTERY BYPASS GRAFTING (CABG) x 4 Using left internal mammary artery and saphenous vein. (N/A) TRANSESOPHAGEAL ECHOCARDIOGRAM (TEE) (N/A) BILATERAL SAPHENOUS VEIN HARVEST (Bilateral) Subjective: Looks and feels well  Objective: Vital signs in last 24 hours: Temp:  [98 F (36.7 C)-98.4 F (36.9 C)] 98.2 F (36.8 C) (05/09 0301) Pulse Rate:  [78-89] 85 (05/09 0301) Cardiac Rhythm:  [-] Normal sinus rhythm (05/09 0720) Resp:  [18] 18 (05/09 0301) BP: (139-171)/(49-70) 171/64 mmHg (05/09 0301) SpO2:  [95 %-98 %] 95 % (05/09 0301) Weight:  [130 lb 12.8 oz (59.33 kg)] 130 lb 12.8 oz (59.33 kg) (05/09 0301)  Hemodynamic parameters for last 24 hours:    Intake/Output from previous day: 05/08 0701 - 05/09 0700 In: 918 [P.O.:918] Out: 350 [Urine:350] Intake/Output this shift:    General appearance: alert, cooperative and no distress Heart: regular rate and rhythm Lungs: clear to auscultation bilaterally Abdomen: benign Extremities: no edema Wound: incis healing well  Lab Results:  Recent Labs  04/23/15 0305 04/24/15 0445  WBC 10.6* 8.6  HGB 8.3* 8.3*  HCT 25.0* 25.1*  PLT 214 287   BMET:  Recent Labs  04/23/15 0305 04/24/15 0445  NA 135 136  K 3.8 3.6  CL 103 101  CO2 21* 24  GLUCOSE 104* 99  BUN 22* 17  CREATININE 0.98 0.78  CALCIUM 8.3* 8.4*    PT/INR: No results for input(s): LABPROT, INR in the last 72 hours. ABG    Component Value Date/Time   PHART 7.377 04/21/2015 0144   HCO3 22.0 04/21/2015 0144   TCO2 20 04/21/2015 1631   ACIDBASEDEF 3.0* 04/21/2015 0144   O2SAT 98.0 04/21/2015 0144   CBG (last 3)   Recent Labs  04/23/15 2135 04/24/15 0612 04/25/15 0524  GLUCAP 122* 100* 89    Meds Scheduled Meds: . acetaminophen  1,000 mg Oral 4 times per day  . amLODipine  5 mg Oral Daily  . aspirin EC  325  mg Oral Daily  . bisacodyl  10 mg Oral Daily   Or  . bisacodyl  10 mg Rectal Daily  . carvedilol  6.25 mg Oral BID WC  . cholecalciferol  1,000 Units Oral Daily  . docusate sodium  200 mg Oral Daily  . enoxaparin (LOVENOX) injection  40 mg Subcutaneous QHS  . ferrous sulfate  325 mg Oral Q breakfast  . folic acid  1 mg Oral Daily  . furosemide  40 mg Oral Daily  . lisinopril  10 mg Oral Daily  . pantoprazole  40 mg Oral Daily  . sodium chloride  3 mL Intravenous Q12H  . vitamin C  1,000 mg Oral Daily   Continuous Infusions:  PRN Meds:.sodium chloride, ALPRAZolam, alum & mag hydroxide-simeth, guaiFENesin, hydrALAZINE, labetalol, magnesium hydroxide, ondansetron (ZOFRAN) IV, sodium chloride, traMADol, zolpidem  Xrays No results found.  Assessment/Plan: S/P Procedure(s) (LRB): CORONARY ARTERY BYPASS GRAFTING (CABG) x 4 Using left internal mammary artery and saphenous vein. (N/A) TRANSESOPHAGEAL ECHOCARDIOGRAM (TEE) (N/A) BILATERAL SAPHENOUS VEIN HARVEST (Bilateral)  1 doing well, stable for discharge    LOS: 6 days    GOLD,WAYNE E 04/25/2015  Patient seen and examined, agree with above She is ready for dc She says arrangements are not complete at home just yet  Still  refusing statin  Salvatore DecentSteven C. Dorris FetchHendrickson, MD [facility] [phone]

## 2015-04-26 MED ORDER — ATORVASTATIN CALCIUM 20 MG PO TABS
20.0000 mg | ORAL_TABLET | Freq: Every day | ORAL | Status: DC
  Filled 2015-04-26: qty 1

## 2015-04-26 MED ORDER — LISINOPRIL 20 MG PO TABS
20.0000 mg | ORAL_TABLET | Freq: Every day | ORAL | Status: DC
Start: 2015-04-26 — End: 2020-08-11

## 2015-04-26 MED ORDER — ATORVASTATIN CALCIUM 20 MG PO TABS: 20.0000 mg | ORAL_TABLET | Freq: Every day | ORAL | Status: DC

## 2015-04-26 MED ORDER — LISINOPRIL 20 MG PO TABS
20.0000 mg | ORAL_TABLET | Freq: Every day | ORAL | Status: DC
  Administered 2015-04-26: 20 mg via ORAL
  Filled 2015-04-26: qty 1

## 2015-04-26 NOTE — Clinical Social Work Placement (Signed)
   CLINICAL SOCIAL WORK PLACEMENT  NOTE  Date:  04/26/2015  Patient Details  Name: Alexis Moss MRN: [id number] Date of Birth: 05/03/1941  Clinical Social Work is seeking post-discharge placement for this patient at the Skilled  Nursing Facility level of care (*CSW will initial, date and re-position this form in  chart as items are completed):  Yes   Patient/family provided with [facility] Clinical Social Work Department's list of facilities offering this level of care within the geographic area requested by the patient (or if unable, by the patient's family).  Yes   Patient/family informed of their freedom to choose among providers that offer the needed level of care, that participate in Medicare, Medicaid or managed care program needed by the patient, have an available bed and are willing to accept the patient.  Yes   Patient/family informed of [facility]'s ownership interest in [facility] and [facility], as well as of the fact that they are under no obligation to receive care at these facilities.  PASRR submitted to EDS on 04/26/15     PASRR number received on 04/26/15     Existing PASRR number confirmed on       FL2 transmitted to all facilities in geographic area requested by pt/family on 04/26/15     FL2 transmitted to all facilities within larger geographic area on       Patient informed that his/her managed care company has contracts with or will negotiate with certain facilities, including the following:   (list received)     Yes   Patient/family informed of bed offers received.  [facility]     Physician recommends and patient chooses bed at  (none)    Patient to be transferred to [facility], [place] on 04/26/15.  Patient to be transferred to facility by [facility]     Patient family notified on 04/26/15 of transfer.  Name of family member notified:  patient alert and oriented x4.  Patient states she will update her husband who is on  his way to the hospital.     PHYSICIAN Please prepare priority discharge summary, including medications, Please sign FL2     Additional Comment:    _______________________________________________ Rondel BatonIngle, Nyia Tsao C, LCSW 04/26/2015, 11:59 AM

## 2015-04-26 NOTE — Discharge Planning (Signed)
Patient will discharge today per MD order. Patient will discharge to [facility] [place] RN to call report prior to transportation to [phone] station #3 bed assignment #703 Transportation: husband   CSW sent discharge summary to SNF for review.  Packet is complete.  RN, patient and family aware of discharge plans.  Vickii PennaGina Lorrie Gargan, LCSWA [phone]  Psychiatric & Orthopedics (5N 1-16) Clinical Social Worker

## 2015-04-26 NOTE — Progress Notes (Signed)
CARDIAC REHAB PHASE I   Pt appears anxious, c/o RLE numbness, RN at bedside, states it has been off and on since last night.  Pt states MD is aware. Pt declines walk at this time. [facility] discharge education completed. Reviewed IS, sternal precautions, activity progression, exercise, risk factors, diet including heart healthy, sodium restrictions, phase 2 cardiac rehab. Pt verbalized understanding. Pt states she has made changes to her diet and has been following the "eat to live" diet. Pt watching cardiac surgery discharge video, in recliner call light within reach. Pt agrees to phase 2 cardiac rehab. Will send referral to [place].   [phone]   Joylene GrapesMonge, Rylei Masella C, RN, BSN 04/26/2015 10:46 AM

## 2015-04-26 NOTE — Significant Event (Signed)
1606-Patient discharge to SNF [facility]). To be transported there by her spouse. Yellow package and AVS given to patient. Prescriptions given to patient. Patient and spouse verbalize understanding of AVS (medications, follow-up appointments). Patient requested order for statin and information for financial assistance. RN notified NP for statin order and gave information to patient regarding patient financial assistance through [facility]. Patient requested for pain medication "for the long road", RN gave ultram 1 tablet.   [facility]. All personal belongings taken with patient. Report given to receiving RN in [facility]. Raye Slyter, Charity fundraiserN.

## 2015-04-26 NOTE — Clinical Social Work Note (Signed)
Clinical Social Work Assessment  Patient Details  Name: Alexis Moss MRN: [id number] Date of Birth: [birth year]  Date of referral:  04/26/15               Reason for consult:  Facility Placement                Permission sought to share information with:  Oceanographeracility Contact Representative Permission granted to share information::  Yes, Verbal Permission Granted  Name::        Agency::   (Facilities in [place]: [facility] [place])  Relationship::     Contact Information:     Housing/Transportation Living arrangements for the past 2 months:  Single Family Home Source of Information:  Patient Patient Interpreter Needed:  None Criminal Activity/Legal Involvement Pertinent to Current Situation/Hospitalization:  No - Comment as needed Significant Relationships:  Spouse Lives with:  Spouse Do you feel safe going back to the place where you live?  No (Feels need for SNF) Need for family participation in patient care:  No (Coment)  Care giving concerns:  No caregiver consulted.  No family at bedside.  Patient is alert and oriented x4.   Social Worker assessment / plan:  CSW assessed patient at bedside.  Patient [place] in [place], but would like to [place] STR in [place].  [facility] [place]  [facility] was able to accept this patient for STR today.  MD and RNCM updated.    Employment status:  Retired Health and safety inspectornsurance information:  Medicare PT Recommendations:  Skilled Nursing Facility Information / Referral to community resources:  Skilled Nursing Facility  Patient/Family's Response to care:  Patient was excited that [facility] was able to accept her for STR.  Patient states her [facility] and received exceptional care.  Patient/Family's Understanding of and Emotional Response to Diagnosis, Current Treatment, and Prognosis:  Patient seems to have a clear understanding of her medical status.  Patient presents as understanding and  optimistic regarding her prognosis.  Emotional Assessment Appearance:  Appears stated age Attitude/Demeanor/Rapport:   (appropriate) Affect (typically observed):  Accepting, Pleasant, Stable, Calm, Hopeful Orientation:  Oriented to Place, Oriented to  Time, Oriented to Situation, Oriented to Self Alcohol / Substance use:  Not Applicable Psych involvement (Current and /or in the community):  No (Comment)  Discharge Needs  Concerns to be addressed:  No discharge needs identified Readmission within the last 30 days:    Current discharge risk:  None Barriers to Discharge:  No Barriers Identified   Vickii PennaGina Tahliyah Anagnos, LCSW [phone]  Psychiatric & Orthopedics (5N 1-8) Clinical Social Worker     04/26/2015, 11:55 AM

## 2015-04-26 NOTE — Progress Notes (Signed)
Dr. Tyrone SageGerhardt contacted at home number. Notified of patient's c/o progressive numbness, tingling, and pain in feet. M.D. also made aware of patients recent CABG and bilateral carotid bruits (per discharge summary).  No orders at this time. Will continue to monitor patient.

## 2015-04-26 NOTE — Progress Notes (Addendum)
[address]       [place] [id number]             [phone]      6 Days Post-Op Procedure(s) (LRB): CORONARY ARTERY BYPASS GRAFTING (CABG) x 4 Using left internal mammary artery and saphenous vein. (N/A) TRANSESOPHAGEAL ECHOCARDIOGRAM (TEE) (N/A) BILATERAL SAPHENOUS VEIN HARVEST (Bilateral) Subjective: Feels ok, had episode of right leg numbness, resolved spontaneously. Probable positional putting pressure on nerve.   Objective: Vital signs in last 24 hours: Temp:  [97.3 F (36.3 C)-98.5 F (36.9 C)] 98.1 F (36.7 C) (05/10 0445) Pulse Rate:  [77-89] 80 (05/10 0445) Cardiac Rhythm:  [-] Normal sinus rhythm;Bundle branch block (05/09 2010) Resp:  [18] 18 (05/10 0445) BP: (126-166)/(42-61) 155/58 mmHg (05/10 0445) SpO2:  [94 %-97 %] 95 % (05/10 0445) Weight:  [129 lb 3 oz (58.6 kg)] 129 lb 3 oz (58.6 kg) (05/10 0445)  Hemodynamic parameters for last 24 hours:    Intake/Output from previous day: 05/09 0701 - 05/10 0700 In: 600 [P.O.:600] Out: 850 [Urine:850] Intake/Output this shift:    General appearance: alert, cooperative and no distress Heart: regular rate and rhythm Lungs: clear to auscultation bilaterally Abdomen: benign Extremities: no signif edema Wound: incis healing well  Lab Results:  Recent Labs  04/24/15 0445  WBC 8.6  HGB 8.3*  HCT 25.1*  PLT 287   BMET:  Recent Labs  04/24/15 0445  NA 136  K 3.6  CL 101  CO2 24  GLUCOSE 99  BUN 17  CREATININE 0.78  CALCIUM 8.4*    PT/INR: No results for input(s): LABPROT, INR in the last 72 hours. ABG    Component Value Date/Time   PHART 7.377 04/21/2015 0144   HCO3 22.0 04/21/2015 0144   TCO2 20 04/21/2015 1631   ACIDBASEDEF 3.0* 04/21/2015 0144   O2SAT 98.0 04/21/2015 0144   CBG (last 3)   Recent Labs  04/23/15 2135 04/24/15 0612 04/25/15 0524  GLUCAP 122* 100* 89    Meds Scheduled Meds: . amLODipine  5 mg Oral Daily  . aspirin EC  325 mg Oral Daily  .  bisacodyl  10 mg Oral Daily   Or  . bisacodyl  10 mg Rectal Daily  . carvedilol  6.25 mg Oral BID WC  . cholecalciferol  1,000 Units Oral Daily  . docusate sodium  200 mg Oral Daily  . enoxaparin (LOVENOX) injection  40 mg Subcutaneous QHS  . ferrous sulfate  325 mg Oral Q breakfast  . folic acid  1 mg Oral Daily  . furosemide  40 mg Oral Daily  . lisinopril  10 mg Oral Daily  . pantoprazole  40 mg Oral Daily  . sodium chloride  3 mL Intravenous Q12H  . vitamin C  1,000 mg Oral Daily   Continuous Infusions:  PRN Meds:.sodium chloride, ALPRAZolam, alum & mag hydroxide-simeth, guaiFENesin, hydrALAZINE, labetalol, magnesium hydroxide, ondansetron (ZOFRAN) IV, sodium chloride, traMADol, zolpidem  Xrays No results found.  Assessment/Plan: S/P Procedure(s) (LRB): CORONARY ARTERY BYPASS GRAFTING (CABG) x 4 Using left internal mammary artery and saphenous vein. (N/A) TRANSESOPHAGEAL ECHOCARDIOGRAM (TEE) (N/A) BILATERAL SAPHENOUS VEIN HARVEST (Bilateral)  1 doing well 2 pt requests ST SNF and PT agrees- SW to assist 3 BP should tol increased lisinopril  LOS: 7 days    GOLD,WAYNE E 04/26/2015  Patient seen and examined, agree with above Ready for dc  Steven C. Dorris FetchHendrickson, MD [facility] [phone]

## 2015-04-26 NOTE — Significant Event (Signed)
Patient denied further numbness of right lower leg after repositioning in chair, putting her legs down on the floor from recliner position.

## 2015-05-02 ENCOUNTER — Encounter: Payer: Medicare Other | Admitting: Cardiology

## 2015-05-12 ENCOUNTER — Other Ambulatory Visit: Payer: Self-pay | Admitting: Thoracic Surgery (Cardiothoracic Vascular Surgery)

## 2015-05-12 DIAGNOSIS — Z951 Presence of aortocoronary bypass graft: Secondary | ICD-10-CM

## 2015-05-17 ENCOUNTER — Encounter: Payer: Self-pay | Admitting: Thoracic Surgery (Cardiothoracic Vascular Surgery)

## 2015-05-17 ENCOUNTER — Ambulatory Visit (INDEPENDENT_AMBULATORY_CARE_PROVIDER_SITE_OTHER): Payer: Self-pay | Admitting: Thoracic Surgery (Cardiothoracic Vascular Surgery)

## 2015-05-17 ENCOUNTER — Ambulatory Visit
Admission: RE | Admit: 2015-05-17 | Discharge: 2015-05-17 | Disposition: A | Discharge status: Home / Self Care | Payer: BC Managed Care – PPO | Source: Ambulatory Visit | Attending: Thoracic Surgery (Cardiothoracic Vascular Surgery) | Admitting: Thoracic Surgery (Cardiothoracic Vascular Surgery)

## 2015-05-17 VITALS — BP 152/76 | HR 76 | Resp 16

## 2015-05-17 DIAGNOSIS — Z951 Presence of aortocoronary bypass graft: Secondary | ICD-10-CM

## 2015-05-17 DIAGNOSIS — I2510 Atherosclerotic heart disease of native coronary artery without angina pectoris: Secondary | ICD-10-CM

## 2015-05-17 MED ORDER — PREDNISONE 10 MG (21) PO TBPK: ORAL_TABLET | ORAL | Status: DC

## 2015-05-17 NOTE — Progress Notes (Signed)
[address]       [place] [id number]             [phone]       HPI:  Alexis Moss returns today for scheduled postoperative follow-up visit.  She is a 74 year old woman who presented with chest pain and ruled in for non-ST elevation MI. She had coronary artery bypass grafting 4 on 04/20/2015. Her postoperative course was notable for complaints of pain in her right leg and foot. It was otherwise uncomplicated.  Of note, she was found to have moderate bilateral carotid disease and severe peripheral arterial disease with ABIs of 0.49 on the right and 0.54 on the left during her preoperative testing.  She went to a short-term skilled nursing facility for about 10 days and has been home since then. She complains of some incisional pain but is not taking any narcotics. She is still having some pain in her right foot. This tends to be worse at night when she is lying in bed. She has not had any significant swelling in the foot. She denies shortness of breath. She has not had any anginal-type chest pain.  Past Medical History  Diagnosis Date  . Hypertension 03/2015     Current Outpatient Prescriptions  Medication Sig Dispense Refill  . amLODipine (NORVASC) 5 MG tablet Take 1 tablet (5 mg total) by mouth daily. 30 tablet 11  . Ascorbic Acid (VITAMIN C) 1000 MG tablet Take 1,000 mg by mouth daily.    Marland Kitchen. aspirin EC 325 MG EC tablet Take 1 tablet (325 mg total) by mouth daily.    Marland Kitchen. atorvastatin (LIPITOR) 20 MG tablet Take 1 tablet (20 mg total) by mouth daily at 6 PM.    . carvedilol (COREG) 6.25 MG tablet Take 1 tablet (6.25 mg total) by mouth 2 (two) times daily with a meal. 60 tablet 11  . lisinopril (PRINIVIL,ZESTRIL) 20 MG tablet Take 1 tablet (20 mg total) by mouth daily.    Marland Kitchen. MELATONIN PO Take 1 tablet by mouth daily as needed. For sleep per patient    . Multiple Vitamin (MULTIVITAMIN WITH MINERALS) TABS tablet Take 1 tablet by mouth daily.    . Potassium 99  MG TABS Take 1 tablet by mouth daily.    . traMADol (ULTRAM) 50 MG tablet Take 1-2 tablets (50-100 mg total) by mouth every 6 (six) hours as needed for moderate pain. 50 tablet 0  . Vitamin D, Cholecalciferol, 1000 UNITS CAPS Take 1 capsule by mouth daily.    . ferrous sulfate 325 (65 FE) MG tablet Take 1 tablet (325 mg total) by mouth daily with breakfast. 30 tablet 3  . folic acid (FOLVITE) 1 MG tablet Take 1 tablet (1 mg total) by mouth daily. 30 tablet 3   No current facility-administered medications for this visit.    Physical Exam BP 152/76 mmHg  Pulse 76  Resp 16  [id number]% 74 year old woman in no acute distress Alert and oriented 3 with no focal deficits Well-developed well-nourished Lungs with diminished breath sounds at left base, otherwise clear Cardiac regular rate and rhythm normal S1 and S2 no rubs or murmurs No palpable peripheral pulses, no peripheral edema  Diagnostic Tests: Chest x-ray reviewed and shows a small left pleural effusion  Impression: 74 year old woman with severe atherosclerotic cardiovascular disease. She has not seen a physician in many years prior to presenting to the hospital with chest pain. She had 2 separate admissions where she  ruled in for non-STEMI. She refused catheterization the first time around but did have catheterization the second visit. She had severe coronary disease and underwent coronary bypass grafting 4 on May 4.  Overall she is recovering well. She's not having much incisional pain. She does have a moderate left pleural effusion. This does not appear to be large enough to do a thoracentesis, but I do think we should try a steroid taper to see if we can prevent the effusion from enlarging.  She is also complaining of right foot pain. Her foot is not acutely ischemic but she does have severe PAD with an ABI of 0.49 on that side and no palpable pulses. I think we should refer her to vascular surgery to further evaluate that  issue.  Plan: Follow-up was scheduled visit with Dr. Anne Fu.  Referral to vascular surgery to evaluate PAD.  Prednisone taper  I will see her back in a month with a repeat PA and lateral chest x-ray  Loreli Slot, MD [facility] [phone]

## 2015-05-23 ENCOUNTER — Ambulatory Visit (INDEPENDENT_AMBULATORY_CARE_PROVIDER_SITE_OTHER): Payer: BC Managed Care – PPO | Admitting: Cardiology

## 2015-05-23 ENCOUNTER — Encounter: Payer: Self-pay | Admitting: Cardiology

## 2015-05-23 VITALS — BP 158/72 | HR 70 | Ht 64.0 in | Wt 125.1 lb

## 2015-05-23 DIAGNOSIS — Z951 Presence of aortocoronary bypass graft: Secondary | ICD-10-CM | POA: Diagnosis not present

## 2015-05-23 DIAGNOSIS — I10 Essential (primary) hypertension: Secondary | ICD-10-CM | POA: Diagnosis not present

## 2015-05-23 DIAGNOSIS — E785 Hyperlipidemia, unspecified: Secondary | ICD-10-CM | POA: Diagnosis not present

## 2015-05-23 DIAGNOSIS — I160 Hypertensive urgency: Secondary | ICD-10-CM

## 2015-05-23 DIAGNOSIS — I249 Acute ischemic heart disease, unspecified: Secondary | ICD-10-CM

## 2015-05-23 DIAGNOSIS — I214 Non-ST elevation (NSTEMI) myocardial infarction: Secondary | ICD-10-CM

## 2015-05-23 NOTE — Progress Notes (Signed)
Cardiology Office Note   Date:  05/23/2015   ID:  Alexis KielCarol Moss, DOB 14-May-1941, MRN [id number]  PCP:  No PCP Per Patient  Cardiologist:   Donato SchultzSKAINS, Alleen Kehm, MD       History of Present Illness: Alexis KielCarol Moss is a 74 y.o. female who presents for Follow-up of coronary artery disease status post bypass grafting 4 in the setting of non-ST elevation myocardial infarction. Originally she had declined cardiac catheterization.  Family history protein S deficiency.  "Home health was a joke"she states. She is going to go forward with cardiac rehabilitation.  She has minor chest wall pain as expected from surgery. She experiences some shortness of breath when she becomes stressed.    Past Medical History  Diagnosis Date  . Hypertension 03/2015  . CAD (coronary artery disease)   . Carotid disease, bilateral 04/2014    40-59% bilaterally  . Peripheral arterial disease 04/2014    ABI 0.49 on right and 0.54 left    Past Surgical History  Procedure Laterality Date  . Tonsillectomy    . Cardiac catheterization N/A 04/19/2015    Procedure: Left Heart Cath and Coronary Angiography;  Surgeon: Peter M SwazilandJordan, MD;  [facility];  Service: Cardiovascular;  Laterality: N/A;  . Cardiac catheterization N/A 04/19/2015    Procedure: Intravascular Ultrasound/IVUS;  Surgeon: Peter M SwazilandJordan, MD;  [facility];  Service: Cardiovascular;  Laterality: N/A;  . Coronary artery bypass graft N/A 04/20/2015    Procedure: CORONARY ARTERY BYPASS GRAFTING (CABG) x 4 Using left internal mammary artery and saphenous vein.;  Surgeon: Loreli SlotSteven C Hendrickson, MD;  Location: [facility] OR;  Service: Open Heart Surgery;  Laterality: N/A;  . Tee without cardioversion N/A 04/20/2015    Procedure: TRANSESOPHAGEAL ECHOCARDIOGRAM (TEE);  Surgeon: Loreli SlotSteven C Hendrickson, MD;  [facility];  Service: Open Heart Surgery;  Laterality: N/A;  . Vein harvest Bilateral 04/20/2015    Procedure: BILATERAL  SAPHENOUS VEIN HARVEST;  Surgeon: Loreli SlotSteven C Hendrickson, MD;  [facility];  Service: Open Heart Surgery;  Laterality: Bilateral;     Current Outpatient Prescriptions  Medication Sig Dispense Refill  . amLODipine (NORVASC) 5 MG tablet Take 1 tablet (5 mg total) by mouth daily. 30 tablet 11  . Ascorbic Acid (VITAMIN C) 1000 MG tablet Take 1,000 mg by mouth daily.    Marland Kitchen. aspirin EC 325 MG EC tablet Take 1 tablet (325 mg total) by mouth daily.    Marland Kitchen. atorvastatin (LIPITOR) 20 MG tablet Take 1 tablet (20 mg total) by mouth daily at 6 PM.    . carvedilol (COREG) 6.25 MG tablet Take 1 tablet (6.25 mg total) by mouth 2 (two) times daily with a meal. 60 tablet 11  . ferrous sulfate 325 (65 FE) MG tablet Take 1 tablet (325 mg total) by mouth daily with breakfast. 30 tablet 3  . folic acid (FOLVITE) 1 MG tablet Take 1 tablet (1 mg total) by mouth daily. 30 tablet 3  . lisinopril (PRINIVIL,ZESTRIL) 20 MG tablet Take 1 tablet (20 mg total) by mouth daily.    . Multiple Vitamin (MULTIVITAMIN WITH MINERALS) TABS tablet Take 1 tablet by mouth daily.    Marland Kitchen. NITROSTAT 0.4 MG SL tablet Place 0.4 mg under the tongue every 5 (five) minutes as needed for chest pain.     Marland Kitchen. Potassium 99 MG TABS Take 1 tablet by mouth daily.    . traMADol (ULTRAM) 50 MG tablet Take 1-2 tablets (50-100  mg total) by mouth every 6 (six) hours as needed for moderate pain. 50 tablet 0  . Vitamin D, Cholecalciferol, 1000 UNITS CAPS Take 1 capsule by mouth daily.     No current facility-administered medications for this visit.    Allergies:   Codeine    Social History:  The patient  reports that she has never smoked. She has never used smokeless tobacco. She reports that she does not drink alcohol or use illicit drugs.   Family History:  The patient's family history includes Deep vein thrombosis in an other family member; Diabetes in her mother and sister; Emphysema in her father; Heart disease in her mother; Protein S deficiency in her  brother, sister, and another family member; Stroke in her brother.    ROS:  Please see the history of present illness.   Otherwise, review of systems are positive for none.   All other systems are reviewed and negative.    PHYSICAL EXAM: VS:  BP 158/72 mmHg  Pulse 70  Ht  (1.626 m)  Wt 125 lb 1.9 oz (56.754 kg)  BMI 21.47 kg/m2  SpO2 99% , BMI Body mass index is 21.47 kg/(m^2). GEN: Well nourished, well developed, in no acute distress HEENT: normal Neck: no JVD, mild carotid bruits, or masses Cardiac: RRR; no murmurs, rubs, or gallops,no edema chest wall scar looked good Respiratory:  clear to auscultation bilaterally, normal work of breathing GI: soft, nontender, nondistended, + BS MS: no deformity or atrophy Skin: warm and dry, no rash, diminished pedal pulses Neuro:  Strength and sensation are intact Psych: euthymic mood, full affect   EKG:  EKG is not ordered today.    Recent Labs: 04/15/2015: B Natriuretic Peptide 181.5*; TSH 2.257 04/18/2015: ALT 12* 04/21/2015: Magnesium 2.4 04/24/2015: BUN 17; Creatinine 0.78; Hemoglobin 8.3*; Platelets 287; Potassium 3.6; Sodium 136    Lipid Panel    Component Value Date/Time   CHOL 230* 04/15/2015 0615   TRIG 87 04/15/2015 0615   HDL 41 04/15/2015 0615   CHOLHDL 5.6 04/15/2015 0615   VLDL 17 04/15/2015 0615   LDLCALC 172* 04/15/2015 0615      Wt Readings from Last 3 Encounters:  05/23/15 125 lb 1.9 oz (56.754 kg)  04/26/15 129 lb 3 oz (58.6 kg)  04/15/15 129 lb 6.4 oz (58.695 kg)      Other studies Reviewed: Additional studies/ records that were reviewed today include: prior office notes, hospitalization, lab work. Review of the above records demonstrates: as above   ASSESSMENT AND PLAN:  1.  Coronary artery disease-bypass surgery-Dr. Dorris Fetch CABG x 4 utilizing LIMA to LAD, SVG to Diagonal, SVG to OM, and SVG to distal RCA.04/20/15. Progressing well. Continue with aspirin. Secondary prevention.beta blocker,  statin, ACE inhibitor.  2. Peripheral arterial disease-ABIs 0.4-Dr. Dorris Fetch referred to vascular surgery but she refuses. She is now using essential oils she states. Continue with secondary prevention.  3. Hyperlipidemia-continue with atorvastatin. I would like to increase dosage to 40 mg (high-intensity dose) however I'm concerned that she will not want to continue with this. We will check lipid profile likely at next visit.  4. Essential hypertension-multiple medications reviewed. Mildly elevated today however she showed me some blood pressure readings from home that were for the most part in the normal range. Reassuring.   Current medicines are reviewed at length with the patient today.  The patient does not have concerns regarding medicines.  The following changes have been made:  no change  Labs/ tests ordered  today include: none  No orders of the defined types were placed in this encounter.     Disposition:   FU with Cantrell Martus in 3 months  Signed, Donato Schultz, MD  05/23/2015 10:17 AM    [facility] [address] [place], [place], [place]  [address] Phone: [phone]; Fax: [phone]

## 2015-05-23 NOTE — Patient Instructions (Signed)
Medication Instructions:  Your physician recommends that you continue on your current medications as directed. Please refer to the Current Medication list given to you today.  Follow-Up: Follow up in 3 months with Dr Skains.  Thank you for choosing [facility]!!     

## 2015-06-15 ENCOUNTER — Other Ambulatory Visit: Payer: Self-pay | Admitting: Thoracic Surgery (Cardiothoracic Vascular Surgery)

## 2015-06-15 DIAGNOSIS — Z951 Presence of aortocoronary bypass graft: Secondary | ICD-10-CM

## 2015-06-21 ENCOUNTER — Encounter: Payer: Self-pay | Admitting: Thoracic Surgery (Cardiothoracic Vascular Surgery)

## 2015-06-21 ENCOUNTER — Ambulatory Visit (INDEPENDENT_AMBULATORY_CARE_PROVIDER_SITE_OTHER): Payer: Self-pay | Admitting: Thoracic Surgery (Cardiothoracic Vascular Surgery)

## 2015-06-21 ENCOUNTER — Ambulatory Visit
Admission: RE | Admit: 2015-06-21 | Discharge: 2015-06-21 | Disposition: A | Discharge status: Home / Self Care | Payer: BC Managed Care – PPO | Source: Ambulatory Visit | Attending: Thoracic Surgery (Cardiothoracic Vascular Surgery) | Admitting: Thoracic Surgery (Cardiothoracic Vascular Surgery)

## 2015-06-21 VITALS — BP 180/76 | HR 80 | Resp 20 | Ht 64.0 in | Wt 128.0 lb

## 2015-06-21 DIAGNOSIS — I2510 Atherosclerotic heart disease of native coronary artery without angina pectoris: Secondary | ICD-10-CM

## 2015-06-21 DIAGNOSIS — Z951 Presence of aortocoronary bypass graft: Secondary | ICD-10-CM

## 2015-06-21 NOTE — Progress Notes (Signed)
[address]       [place] [id number]             [phone]       HPI:  Mrs. Alexis Moss is a 74 yo woman who had coronary bypass grafting on 04/20/2015. She last in the office on 05/17/2015. She had a small left pleural effusion. That was treated with a prednisone taper.  She also is complaining of rest pain in her right foot. She had ABIs of 0.4 documented in the hospital. I referred her to vascular surgery. She did not go see vascular surgery, but has been using "natural" methods for healing. She says she's not having any pain in the foot any more and she is able to sleep through the night without it hurting. She says she's not having any claudication.  She is still having some pain primarily along the left side of her incision. It is very tender. She does not feel any clicking or popping. She has not had any anginal-type chest pain.  She complains of a lump in her medial right thigh. It is not tender.  Past Medical History  Diagnosis Date  . Hypertension 03/2015  . CAD (coronary artery disease)   . Carotid disease, bilateral 04/2014    40-59% bilaterally  . Peripheral arterial disease 04/2014    ABI 0.49 on right and 0.54 left       Current Outpatient Prescriptions  Medication Sig Dispense Refill  . amLODipine (NORVASC) 5 MG tablet Take 1 tablet (5 mg total) by mouth daily. 30 tablet 11  . Ascorbic Acid (VITAMIN C) 1000 MG tablet Take 1,000 mg by mouth daily.    Marland Kitchen. aspirin EC 325 MG EC tablet Take 1 tablet (325 mg total) by mouth daily.    . carvedilol (COREG) 6.25 MG tablet Take 1 tablet (6.25 mg total) by mouth 2 (two) times daily with a meal. 60 tablet 11  . ferrous sulfate 325 (65 FE) MG tablet Take 1 tablet (325 mg total) by mouth daily with breakfast. 30 tablet 3  . folic acid (FOLVITE) 1 MG tablet Take 1 tablet (1 mg total) by mouth daily. 30 tablet 3  . lisinopril (PRINIVIL,ZESTRIL) 20 MG tablet Take 1 tablet (20 mg total) by mouth daily.    .  Multiple Vitamin (MULTIVITAMIN WITH MINERALS) TABS tablet Take 1 tablet by mouth daily.    Marland Kitchen. NITROSTAT 0.4 MG SL tablet Place 0.4 mg under the tongue every 5 (five) minutes as needed for chest pain.     Marland Kitchen. Potassium 99 MG TABS Take 1 tablet by mouth daily.    . traMADol (ULTRAM) 50 MG tablet Take 1-2 tablets (50-100 mg total) by mouth every 6 (six) hours as needed for moderate pain. 50 tablet 0  . Vitamin D, Cholecalciferol, 1000 UNITS CAPS Take 1 capsule by mouth daily.     No current facility-administered medications for this visit.    Physical Exam BP 180/76 mmHg  Pulse 80  Resp 20  Ht 5\' 4"  (1.626 m)  Wt 128 lb (58.06 kg)  BMI 21.96 kg/m2  SpO732 2495% 74 year old woman in no acute distress Alert and oriented 3 with no focal neurologic deficits Lungs clear with equal breath sounds bilaterally Sternum stable, tender to palpation along third and fourth costal cartilages. No clicking or popping Incision well-healed No peripheral edema, 1 cm nodule palpable mid right thigh along saphenous vein harvest tunnel, consistent with hematoma/seroma  Diagnostic Tests: Her chest  x-ray was reviewed. It shows resolution of her left pleural effusion. There is no active disease.  Impression: 74 year old woman who is about 2 months out from coronary bypass grafting. She is doing well from a cardiac standpoint. She does still have some incisional pain. At this point I think she might be better off with an NSAID such as Motrin or Aleve rather than narcotics. She does not think the Ultram is helping much anyway.  Her pleural effusion resolved.  Her blood pressure was markedly elevated today. She says that her blood pressure is never been this time. She does not believe the reading. She refused any medication change.  She has severe peripheral arterial disease. At her last visit she was complaining of symptoms of classic rest pain. She refused to see a vascular surgeon. She has been using "natural"  remedies for that. She says that she is not having any pain in the foot at present, and does not want to see a vascular surgeon.  There are no restrictions on her activities at this point she was cautioned that she may continue to have some incisional pain for quite some time. She was advised to build into new activities gradually.  Plan: I will be happy to see her back any time if I can be of any further assistance with her care  Loreli Slot, MD [facility] [phone]

## 2015-08-26 ENCOUNTER — Ambulatory Visit: Payer: BC Managed Care – PPO | Admitting: Cardiology

## 2017-04-06 IMAGING — CT CT HEAD W/O CM
1 of 2 series · 13 of 30 positions shown, 17 images · non-contrast
Comparison: None.

CLINICAL DATA: 73-year-old female with 3 day history of frontal
headache and near syncope

EXAM:
CT HEAD WITHOUT CONTRAST
TECHNIQUE: Contiguous axial images were obtained from the base of the skull
through the vertex without intravenous contrast.

[Series 3: head 5.0 h30s · axial · 0.46mm/px · z∈[-129,-9]mm · 13 of 30 slices shown, 17 images]
[im 3/30  brain]
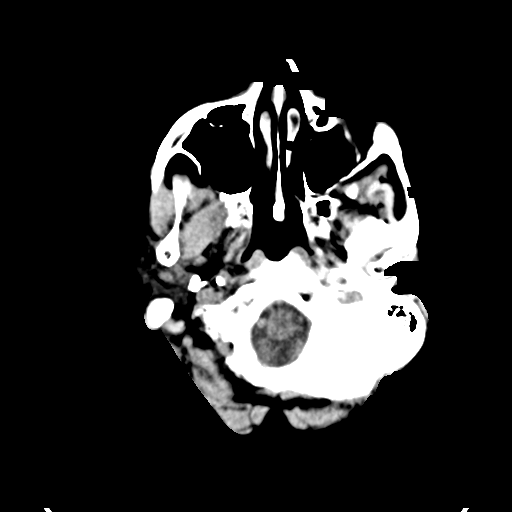
[im 3/30  bone]
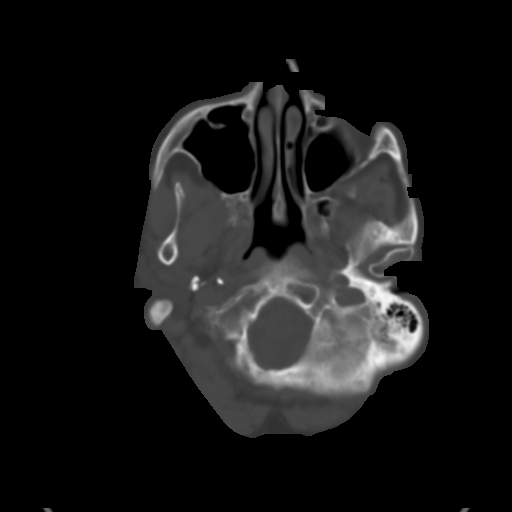
[im 5/30  brain]
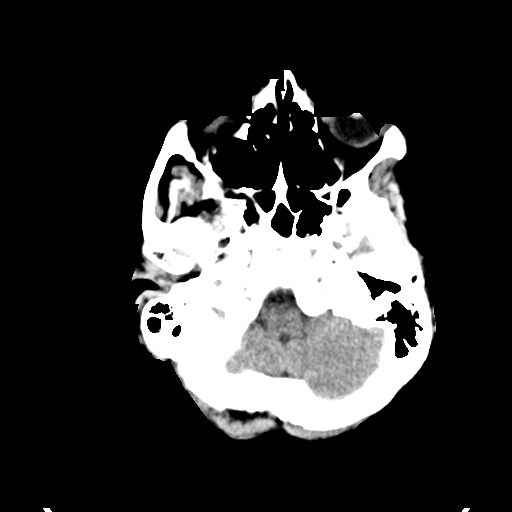
[im 7/30  brain]
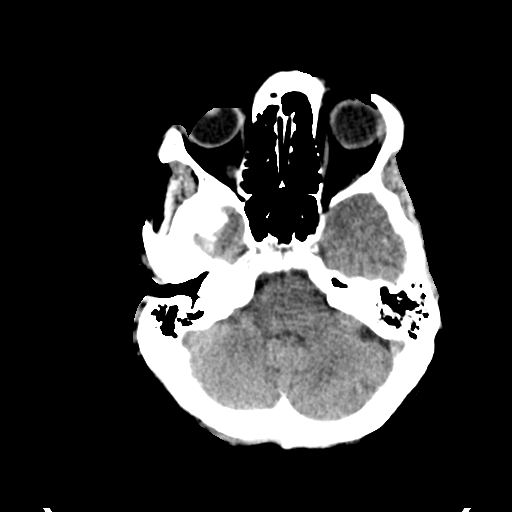
[im 9/30  brain]
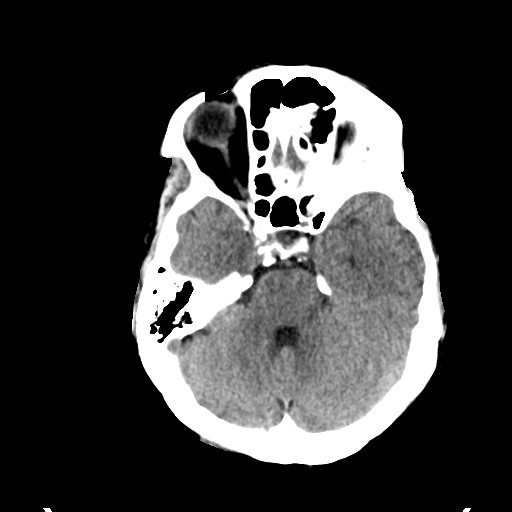
[im 11/30  brain]
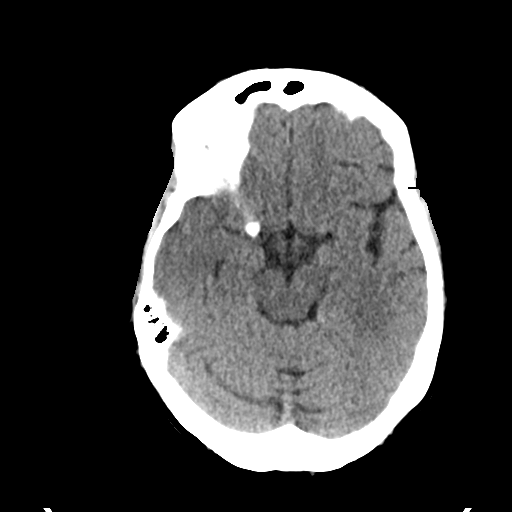
[im 11/30  bone]
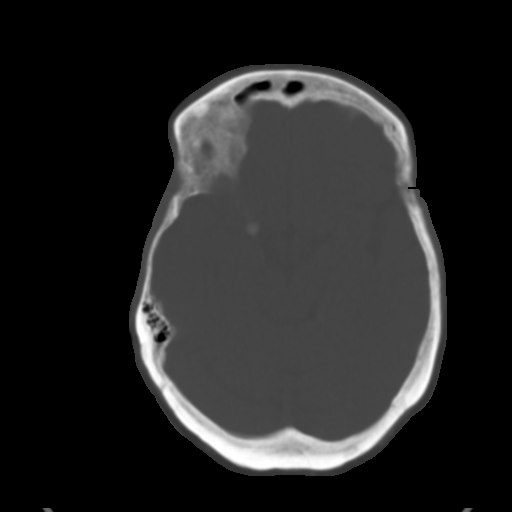
[im 13/30  brain]
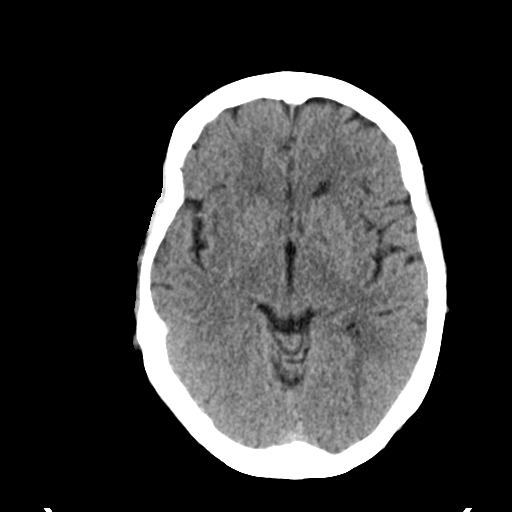
[im 15/30  brain]
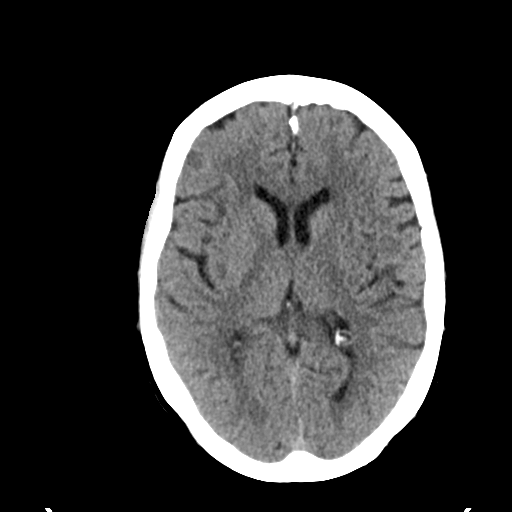
[im 17/30  brain]
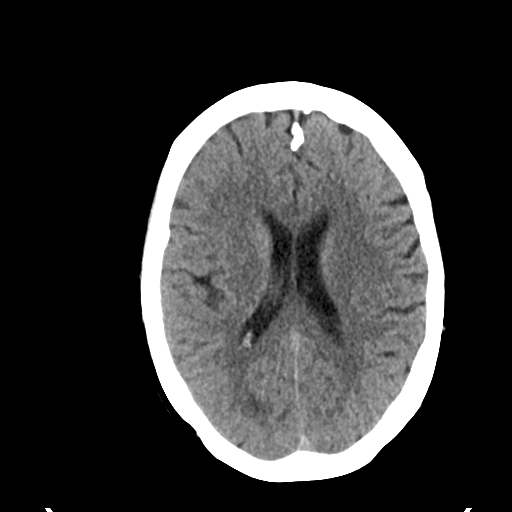
[im 19/30  brain]
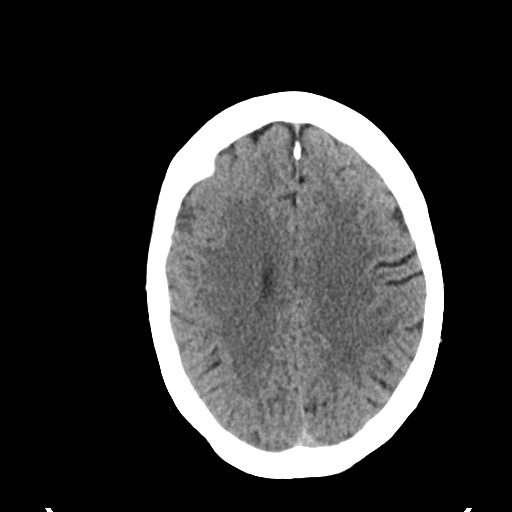
[im 19/30  bone]
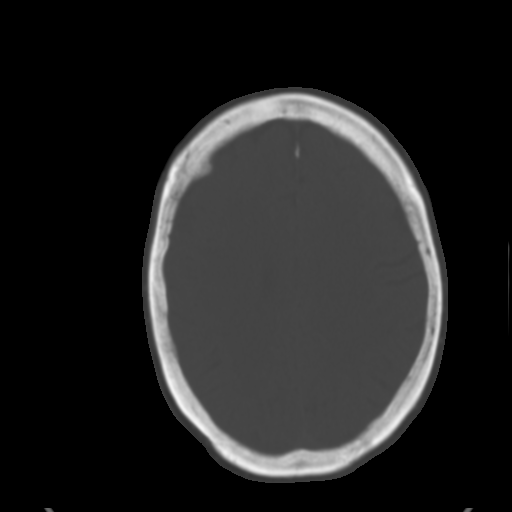
[im 21/30  brain]
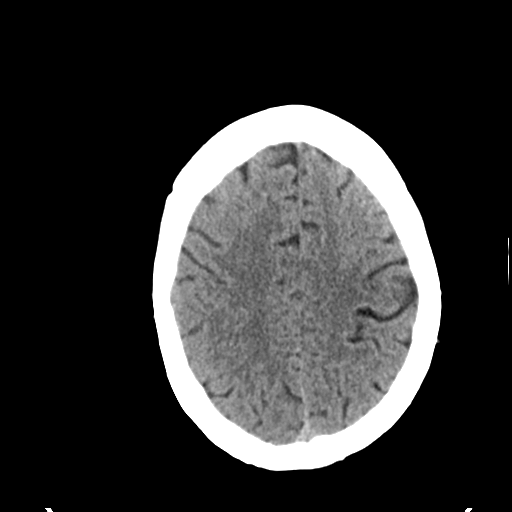
[im 23/30  brain]
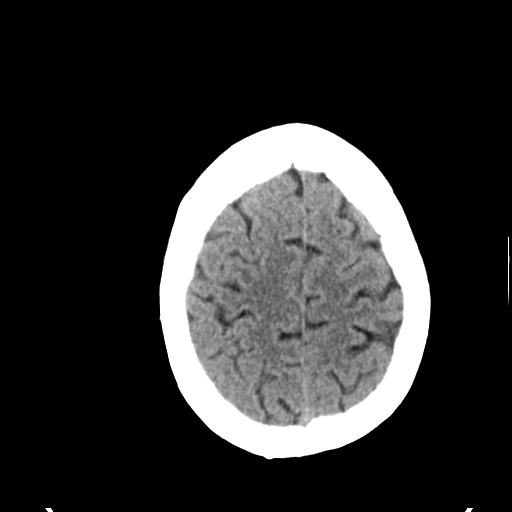
[im 25/30  brain]
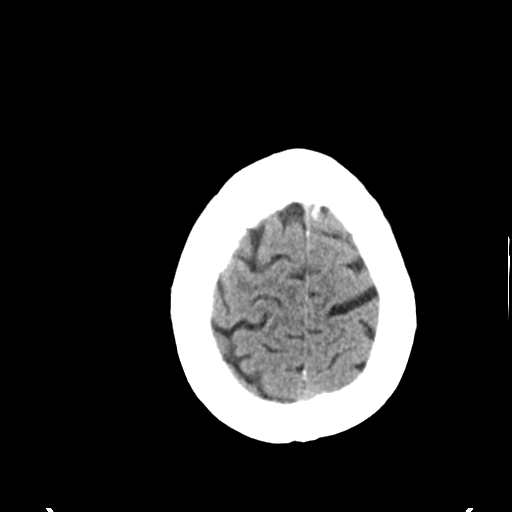
[im 27/30  brain]
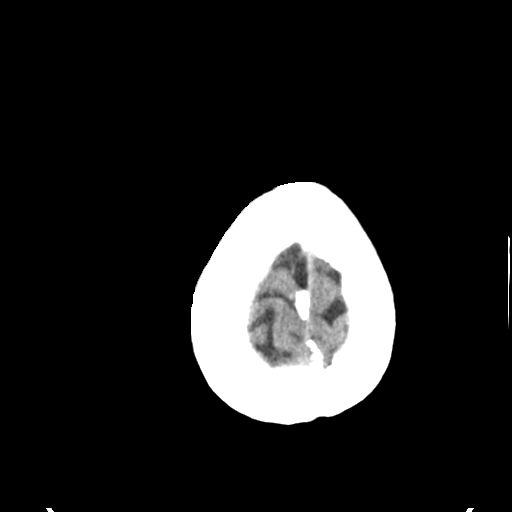
[im 27/30  bone]
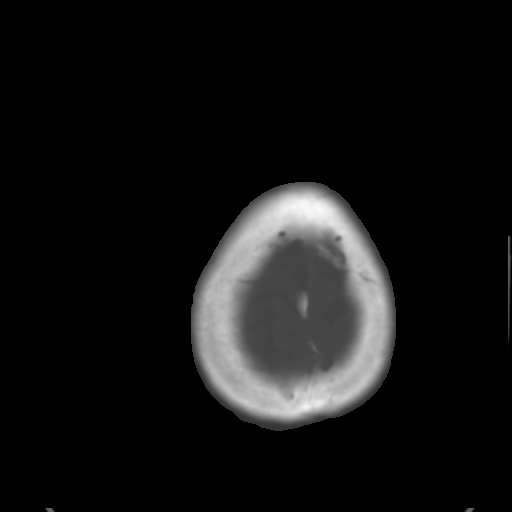

[13 of 30 positions shown; findings below may reference images not displayed]

FINDINGS: Negative for acute intracranial hemorrhage, acute infarction, mass,
mass effect, hydrocephalus or midline shift. Gray-white
differentiation is preserved throughout. No focal soft tissue or
calvarial abnormality. Globes and orbits are symmetric bilaterally.
Normal aeration of the mastoid air cells and visualized paranasal
sinuses. Atherosclerotic calcification present in both cavernous
carotid arteries.
IMPRESSION: 1. No acute intracranial abnormality.
2. Intracranial atherosclerosis.

## 2020-01-04 DIAGNOSIS — I361 Nonrheumatic tricuspid (valve) insufficiency: Secondary | ICD-10-CM

## 2020-01-04 DIAGNOSIS — I371 Nonrheumatic pulmonary valve insufficiency: Secondary | ICD-10-CM

## 2020-01-04 DIAGNOSIS — I340 Nonrheumatic mitral (valve) insufficiency: Secondary | ICD-10-CM

## 2020-08-11 ENCOUNTER — Encounter: Payer: Self-pay | Admitting: Cardiology

## 2020-08-11 ENCOUNTER — Ambulatory Visit: Payer: Medicare PPO | Admitting: Cardiology

## 2020-08-11 ENCOUNTER — Other Ambulatory Visit: Payer: Self-pay

## 2020-08-11 VITALS — BP 158/82 | HR 77 | Ht 64.0 in | Wt 142.4 lb

## 2020-08-11 DIAGNOSIS — I2510 Atherosclerotic heart disease of native coronary artery without angina pectoris: Secondary | ICD-10-CM

## 2020-08-11 DIAGNOSIS — R011 Cardiac murmur, unspecified: Secondary | ICD-10-CM | POA: Insufficient documentation

## 2020-08-11 DIAGNOSIS — E782 Mixed hyperlipidemia: Secondary | ICD-10-CM | POA: Diagnosis not present

## 2020-08-11 DIAGNOSIS — Z951 Presence of aortocoronary bypass graft: Secondary | ICD-10-CM

## 2020-08-11 DIAGNOSIS — I10 Essential (primary) hypertension: Secondary | ICD-10-CM

## 2020-08-11 DIAGNOSIS — R0989 Other specified symptoms and signs involving the circulatory and respiratory systems: Secondary | ICD-10-CM

## 2020-08-11 HISTORY — DX: Other specified symptoms and signs involving the circulatory and respiratory systems: R09.89

## 2020-08-11 HISTORY — DX: Cardiac murmur, unspecified: R01.1

## 2020-08-11 MED ORDER — AMLODIPINE BESYLATE 10 MG PO TABS: 10.0000 mg | ORAL_TABLET | Freq: Every day | ORAL | 3 refills | Status: AC

## 2020-08-11 MED ORDER — ROSUVASTATIN CALCIUM 10 MG PO TABS: 10.0000 mg | ORAL_TABLET | Freq: Every day | ORAL | 6 refills | Status: DC

## 2020-08-11 NOTE — Patient Instructions (Addendum)
Medication Instructions:  Your physician has recommended you make the following change in your medication:   Increase your Amlodipine to 10 mg daily. Stop the Lisinopril   *If you need a refill on your cardiac medications before your next appointment, please call your pharmacy*   Lab Work: Your physician recommends that you have a BMET and LFT today in the office.   Your physician recommends that you return for lab work in: before your next visit.  You need to have labs done when you are fasting.  You can come Monday through Friday 8:30 am to 12:00 pm and 1:15 to 4:30. You do not need to make an appointment as the order has already been placed. The labs you are going to have done are  LFT and Lipids.   If you have labs (blood work) drawn today and your tests are completely normal, you will receive your results only by:  MyChart Message (if you have MyChart) OR  A paper copy in the mail If you have any lab test that is abnormal or we need to change your treatment, we will call you to review the results.   Testing/Procedures: Your physician has requested that you have a carotid duplex. This test is an ultrasound of the carotid arteries in your neck. It looks at blood flow through these arteries that supply the brain with blood. Allow one hour for this exam. There are no restrictions or special instructions.  Your physician has requested that you have an echocardiogram. Echocardiography is a painless test that uses sound waves to create images of your heart. It provides your doctor with information about the size and shape of your heart and how well your hearts chambers and valves are working. This procedure takes approximately one hour. There are no restrictions for this procedure.     Follow-Up: At [facility], you and your health needs are our priority.  As part of our continuing mission to provide you with exceptional heart care, we have created designated Provider Care  Teams.  These Care Teams include your primary Cardiologist (physician) and Advanced Practice Providers (APPs -  Physician Assistants and Nurse Practitioners) who all work together to provide you with the care you need, when you need it.  We recommend signing up for the patient portal called "MyChart".  Sign up information is provided on this After Visit Summary.  MyChart is used to connect with patients for Virtual Visits (Telemedicine).  Patients are able to view lab/test results, encounter notes, upcoming appointments, etc.  Non-urgent messages can be sent to your provider as well.   To learn more about what you can do with MyChart, go to [email or web address].    Your next appointment:   5 week(s)  The format for your next appointment:   In Person  Provider:   Belva Crome, MD   Other Instructions NA

## 2020-08-11 NOTE — Progress Notes (Signed)
Cardiology Office Note:    Date:  08/11/2020   ID:  Alexis Moss, DOB Jan 02, 1941, MRN [id number]  PCP:  Patient, No Pcp Per  Cardiologist:  Garwin Brothers, MD   Referring MD: Philemon Kingdom, MD    ASSESSMENT:    1. S/P CABG x 4   2. Mixed hyperlipidemia   3. Essential hypertension   4. Coronary artery disease involving native coronary artery of native heart without angina pectoris   5. Bruit of right carotid artery    PLAN:    In order of problems listed above:  1. Coronary artery disease: Secondary prevention stressed with patient.  Importance of compliance with diet medication stressed and she vocalized understanding.  She is going to begin an exercise program after her upper respiratory tract infection gets better. 2. Marked elevation in LDL and triglycerides: Diet was emphasized and she understands.  I discussed extensively with her about lipid-lowering medications and she is agreeable.  We will initiate her on rosuvastatin 10 mg daily.  She will have a Chem-7 and liver panel today.  She will be back in 1 month for a liver lipid check fasting.  Benefits and potential is explained and she vocalized understanding. 3. Essential hypertension: She is having cough with lisinopril so I switched her medications and doubled her amlodipine to 10 mg daily.  She will keep a track of her blood pressures. 4. Cardiac murmur: Echocardiogram will be done to assess murmur on auscultation. 5. Patient will be seen in follow-up appointment in 6 weeks or earlier if she has any concerns she had multiple questions which were answered to her satisfaction. 6. Patient has a significant carotid.  Right greater than left and we will do bilateral carotid Dopplers.   Medication Adjustments/Labs and Tests Ordered: Current medicines are reviewed at length with the patient today.  Concerns regarding medicines are outlined above.  Orders Placed This Encounter  Procedures  . Hepatic function panel  .  Hepatic function panel  . Lipid panel  . EKG 12-Lead  . ECHOCARDIOGRAM COMPLETE  . VAS US CAROTID   Meds ordered this encounter  Medications  . amLODipine (NORVASC) 10 MG tablet    Sig: Take 1 tablet (10 mg total) by mouth daily.    Dispense:  90 tablet    Refill:  3  . rosuvastatin (CRESTOR) 10 MG tablet    Sig: Take 1 tablet (10 mg total) by mouth daily.    Dispense:  30 tablet    Refill:  6     History of Present Illness:    Alexis Moss is a 79 y.o. female who is being seen today for the evaluation of coronary artery disease and to get established at the request of Philemon Kingdom, MD.  Patient is a pleasant 79 year old female.  She has past medical history of essential hypertension mixed dyslipidemia and elevated triglycerides.  She has peripheral vascular disease.  She has coronary artery disease post CABG surgery in the past.  She denies any problems at this time and takes care of activities of daily living.  She is dealing with upper respiratory tract infection and recently had a Covid test which was negative.  At the time of my evaluation, the patient is alert awake oriented and in no distress.  Past Medical History:  Diagnosis Date  . CAD (coronary artery disease)   . Carotid disease, bilateral (HCC) 04/2014   40-59% bilaterally  . Hypertension 03/2015  . Peripheral arterial disease (HCC) 04/2014  ABI 0.49 on right and 0.54 left    Past Surgical History:  Procedure Laterality Date  . CARDIAC CATHETERIZATION N/A 04/19/2015   Procedure: Left Heart Cath and Coronary Angiography;  Surgeon: Peter M Swaziland, MD;  Location: [facility];  Service: Cardiovascular;  Laterality: N/A;  . CARDIAC CATHETERIZATION N/A 04/19/2015   Procedure: Intravascular Ultrasound/IVUS;  Surgeon: Peter M Swaziland, MD;  Location: [facility];  Service: Cardiovascular;  Laterality: N/A;  . CORONARY ARTERY BYPASS GRAFT N/A 04/20/2015   Procedure: CORONARY ARTERY BYPASS GRAFTING  (CABG) x 4 Using left internal mammary artery and saphenous vein.;  Surgeon: Loreli Slot, MD;  Location: [facility] OR;  Service: Open Heart Surgery;  Laterality: N/A;  . TEE WITHOUT CARDIOVERSION N/A 04/20/2015   Procedure: TRANSESOPHAGEAL ECHOCARDIOGRAM (TEE);  Surgeon: Loreli Slot, MD;  Location: [facility] OR;  Service: Open Heart Surgery;  Laterality: N/A;  . TONSILLECTOMY    . VEIN HARVEST Bilateral 04/20/2015   Procedure: BILATERAL SAPHENOUS VEIN HARVEST;  Surgeon: Loreli Slot, MD;  Location[facility];  Service: Open Heart Surgery;  Laterality: Bilateral;    Current Medications: Current Meds  Medication Sig  . amLODipine (NORVASC) 10 MG tablet Take 1 tablet (10 mg total) by mouth daily.  . Ascorbic Acid (VITAMIN C) 1000 MG tablet Take 1,000 mg by mouth daily.  Marland Kitchen aspirin 81 MG EC tablet Take 81 mg by mouth daily.  . benzonatate (TESSALON) 100 MG capsule Take 100 mg by mouth 3 (three) times daily as needed for cough.  . carvedilol (COREG) 6.25 MG tablet Take 1 tablet (6.25 mg total) by mouth 2 (two) times daily with a meal.  . Multiple Vitamin (MULTIVITAMIN WITH MINERALS) TABS tablet Take 1 tablet by mouth daily.  Marland Kitchen NITROSTAT 0.4 MG SL tablet Place 0.4 mg under the tongue every 5 (five) minutes as needed for chest pain.   Marland Kitchen Potassium 99 MG TABS Take 1 tablet by mouth daily.  . Vitamin D, Cholecalciferol, 1000 UNITS CAPS Take 1 capsule by mouth daily.  . [DISCONTINUED] amLODipine (NORVASC) 5 MG tablet Take 10 mg by mouth every other day.  . [DISCONTINUED] lisinopril (ZESTRIL) 10 MG tablet Take 10 mg by mouth daily.     Allergies:   Codeine   Social History   Socioeconomic History  . Marital status: Married    Spouse name: Not on file  . Number of children: Not on file  . Years of education: Not on file  . Highest education level: Not on file  Occupational History  . Not on file  Tobacco Use  . Smoking status: Never Smoker  . Smokeless tobacco: Never Used  Substance and  Sexual Activity  . Alcohol use: No    Alcohol/week: 0.0 standard drinks  . Drug use: No  . Sexual activity: Not on file  Other Topics Concern  . Not on file  Social History Narrative  . Not on file   Social Determinants of Health   Financial Resource Strain:   . Difficulty of Paying Living Expenses: Not on file  Food Insecurity:   . Worried About Programme researcher, broadcasting/film/video in the Last Year: Not on file  . Ran Out of Food in the Last Year: Not on file  Transportation Needs:   . Lack of Transportation (Medical): Not on file  . Lack of Transportation (Non-Medical): Not on file  Physical Activity:   . Days of Exercise per Week: Not on file  . Minutes  of Exercise per Session: Not on file  Stress:   . Feeling of Stress : Not on file  Social Connections:   . Frequency of Communication with Friends and Family: Not on file  . Frequency of Social Gatherings with Friends and Family: Not on file  . Attends Religious Services: Not on file  . Active Member of Clubs or Organizations: Not on file  . Attends Banker Meetings: Not on file  . Marital Status: Not on file     Family History: The patient's family history includes Deep vein thrombosis in her unknown relative; Diabetes in her mother and sister; Emphysema in her father; Heart disease in her mother; Protein S deficiency in her brother, sister, and unknown relative; Stroke in her brother.  ROS:   Please see the history of present illness.    All other systems reviewed and are negative.  EKGs/Labs/Other Studies Reviewed:    The following studies were reviewed today: EKG reveals sinus rhythm and nonspecific ST-T changes.   Recent Labs: No results found for requested labs within last 8760 hours.  Recent Lipid Panel    Component Value Date/Time   CHOL 230 (H) 04/15/2015 0615   TRIG 87 04/15/2015 0615   HDL 41 04/15/2015 0615   CHOLHDL 5.6 04/15/2015 0615   VLDL 17 04/15/2015 0615   LDLCALC 172 (H) 04/15/2015 0615     Physical Exam:    VS:  BP (!) 158/82   Pulse 77   Ht 5\' 4"  (1.626 m)   Wt 142 lb 6.4 oz (64.6 kg)   SpO2 96%   BMI 24.44 kg/m     Wt Readings from Last 3 Encounters:  08/11/20 142 lb 6.4 oz (64.6 kg)  06/21/15 128 lb (58.1 kg)  05/23/15 125 lb 1.9 oz (56.8 kg)     GEN: Patient is in no acute distress HEENT: Normal NECK: No JVD; bilateral carotid bruits LYMPHATICS: No lymphadenopathy CARDIAC: S1 S2 regular, 2/6 systolic murmur at the apex. RESPIRATORY:  Clear to auscultation without rales, wheezing or rhonchi  ABDOMEN: Soft, non-tender, non-distended MUSCULOSKELETAL:  No edema; No deformity  SKIN: Warm and dry NEUROLOGIC:  Alert and oriented x 3 PSYCHIATRIC:  Normal affect    Signed, 07/23/15, MD  08/11/2020 2:24 PM    [facility]

## 2020-08-12 ENCOUNTER — Telehealth: Payer: Self-pay | Admitting: Cardiology

## 2020-08-12 LAB — HEPATIC FUNCTION PANEL
ALT: 16 IU/L (ref 0–32)
AST: 23 IU/L (ref 0–40)
Albumin: 4.6 g/dL (ref 3.7–4.7)
Alkaline Phosphatase: 105 IU/L (ref 48–121)
Bilirubin Total: 0.3 mg/dL (ref 0.0–1.2)
Bilirubin, Direct: 0.1 mg/dL (ref 0.00–0.40)
Total Protein: 7.3 g/dL (ref 6.0–8.5)

## 2020-08-12 LAB — BASIC METABOLIC PANEL
BUN/Creatinine Ratio: 21 (ref 12–28)
BUN: 17 mg/dL (ref 8–27)
CO2: 26 mmol/L (ref 20–29)
Calcium: 9.8 mg/dL (ref 8.7–10.3)
Chloride: 94 mmol/L — ABNORMAL LOW (ref 96–106)
Creatinine, Ser: 0.82 mg/dL (ref 0.57–1.00)
GFR calc Af Amer: 79 mL/min/{1.73_m2} (ref >59)
GFR calc non Af Amer: 69 mL/min/{1.73_m2} (ref >59)
Glucose: 100 mg/dL — ABNORMAL HIGH (ref 65–99)
Potassium: 3.8 mmol/L (ref 3.5–5.2)
Sodium: 134 mmol/L (ref 134–144)

## 2020-08-12 NOTE — Telephone Encounter (Signed)
Patient returning call for lab results. 

## 2020-08-12 NOTE — Telephone Encounter (Signed)
Pt was calling to inquire if a lipid panel was done and it was not.

## 2020-09-06 ENCOUNTER — Ambulatory Visit (INDEPENDENT_AMBULATORY_CARE_PROVIDER_SITE_OTHER): Payer: Medicare PPO

## 2020-09-06 ENCOUNTER — Other Ambulatory Visit: Payer: Self-pay

## 2020-09-06 DIAGNOSIS — I2510 Atherosclerotic heart disease of native coronary artery without angina pectoris: Secondary | ICD-10-CM | POA: Diagnosis not present

## 2020-09-06 DIAGNOSIS — R0989 Other specified symptoms and signs involving the circulatory and respiratory systems: Secondary | ICD-10-CM | POA: Diagnosis not present

## 2020-09-06 NOTE — Progress Notes (Signed)
Carotid duplex exam performed  Jimmy Albertha Beattie RDCS, RVT 

## 2020-09-12 ENCOUNTER — Other Ambulatory Visit: Payer: Self-pay

## 2020-09-12 DIAGNOSIS — E782 Mixed hyperlipidemia: Secondary | ICD-10-CM

## 2020-09-12 DIAGNOSIS — I10 Essential (primary) hypertension: Secondary | ICD-10-CM

## 2020-09-12 DIAGNOSIS — Z951 Presence of aortocoronary bypass graft: Secondary | ICD-10-CM

## 2020-09-15 LAB — LIPID PANEL
Chol/HDL Ratio: 4.5 ratio — ABNORMAL HIGH (ref 0.0–4.4)
Cholesterol, Total: 229 mg/dL — ABNORMAL HIGH (ref 100–199)
HDL: 51 mg/dL (ref >39)
LDL Chol Calc (NIH): 138 mg/dL — ABNORMAL HIGH (ref 0–99)
Triglycerides: 226 mg/dL — ABNORMAL HIGH (ref 0–149)
VLDL Cholesterol Cal: 40 mg/dL (ref 5–40)

## 2020-09-15 LAB — HEPATIC FUNCTION PANEL
ALT: 13 IU/L (ref 0–32)
AST: 20 IU/L (ref 0–40)
Albumin: 4.6 g/dL (ref 3.7–4.7)
Alkaline Phosphatase: 99 IU/L (ref 44–121)
Bilirubin Total: 0.4 mg/dL (ref 0.0–1.2)
Bilirubin, Direct: 0.11 mg/dL (ref 0.00–0.40)
Total Protein: 7.4 g/dL (ref 6.0–8.5)

## 2020-09-19 ENCOUNTER — Other Ambulatory Visit: Payer: Self-pay

## 2020-09-20 ENCOUNTER — Other Ambulatory Visit: Payer: Self-pay

## 2020-09-20 ENCOUNTER — Encounter: Payer: Self-pay | Admitting: Cardiology

## 2020-09-20 ENCOUNTER — Ambulatory Visit: Payer: Medicare PPO | Admitting: Cardiology

## 2020-09-20 VITALS — BP 164/88 | HR 64 | Ht 64.0 in | Wt 142.8 lb

## 2020-09-20 DIAGNOSIS — I214 Non-ST elevation (NSTEMI) myocardial infarction: Secondary | ICD-10-CM

## 2020-09-20 DIAGNOSIS — I2510 Atherosclerotic heart disease of native coronary artery without angina pectoris: Secondary | ICD-10-CM

## 2020-09-20 DIAGNOSIS — Z951 Presence of aortocoronary bypass graft: Secondary | ICD-10-CM

## 2020-09-20 DIAGNOSIS — Z79899 Other long term (current) drug therapy: Secondary | ICD-10-CM

## 2020-09-20 DIAGNOSIS — R0989 Other specified symptoms and signs involving the circulatory and respiratory systems: Secondary | ICD-10-CM

## 2020-09-20 DIAGNOSIS — E782 Mixed hyperlipidemia: Secondary | ICD-10-CM

## 2020-09-20 MED ORDER — ROSUVASTATIN CALCIUM 20 MG PO TABS: 20.0000 mg | ORAL_TABLET | Freq: Every day | ORAL | 6 refills | Status: AC

## 2020-09-20 NOTE — Progress Notes (Signed)
Cardiology Office Note:    Date:  09/20/2020   ID:  Alexis Moss, DOB 1941/05/10, MRN [id number]  PCP:  Patient, No Pcp Per  Cardiologist:  Garwin Brothers, MD   Referring MD: No ref. provider found    ASSESSMENT:    1. NSTEMI (non-ST elevated myocardial infarction) (HCC)   2. Coronary artery disease involving native coronary artery of native heart without angina pectoris   3. Bilateral carotid bruits   4. S/P CABG x 4    PLAN:    In order of problems listed above:  1. Coronary artery disease: Secondary prevention stressed with the patient.  Importance of compliance with diet medication stressed and she vocalized understanding. 2. Carotid artery disease: Significant right-sided carotid artery stenosis.  Clinically she is asymptomatic.  However I would like to get a CT of the neck with contrast.  She had a lot of questions which were answered to her satisfaction.  She was not keen on it.  Benefits and potential risks explained and she finally agreed.  We will also give her an appointment with a vascular specialist for possible intervention.  She is agreeable at least two-point speak to the specialist at this time. 3. Essential hypertension: Blood pressure is elevated but I think there is an element of whitecoat hypertension and some anxiety today because of the aforementioned issues.  She will keep a track of blood pressures at home. 4. Mixed dyslipidemia: Diet was emphasized recent lipids were discussed with her at length and I increase rosuvastatin to 20 mg daily liver lipid check in 6 weeks.  Benefits and potential is explained and she vocalized understanding. 5. Patient will be seen in follow-up appointment in 2 months or earlier if the patient has any concerns    Medication Adjustments/Labs and Tests Ordered: Current medicines are reviewed at length with the patient today.  Concerns regarding medicines are outlined above.  No orders of the defined types were placed in this  encounter.  No orders of the defined types were placed in this encounter.    No chief complaint on file.    History of Present Illness:    Alexis Moss is a 79 y.o. female.  Patient has past medical history of coronary artery disease post CABG surgery, essential hypertension dyslipidemia and carotid artery bruit.  She underwent evaluation for this and has significant right-sided internal carotid artery stenosis.  She has been vehemently opposed to lipid-lowering therapy and such.  Finally she has taken rosuvastatin and I am not sure about her compliance.  She is here for follow-up today.  I wanted to discuss also about her carotid findings.  The report is mentioned at length with her today.  At the time of my evaluation, the patient is alert awake oriented and in no distress.  Past Medical History:  Diagnosis Date  . Bilateral carotid bruits 04/14/2015  . CAD (coronary artery disease)   . Cardiac murmur 08/11/2020  . Carotid bruit 08/11/2020  . Carotid disease, bilateral (HCC) 04/2014   40-59% bilaterally  . Hyperlipidemia 04/19/2015  . Hypertension 03/2015  . Hypertensive urgency 04/14/2015  . NSTEMI (non-ST elevated myocardial infarction) (HCC) 04/14/2015  . Peripheral arterial disease (HCC) 04/2014   ABI 0.49 on right and 0.54 left  . 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 CARDIAC CATHETERIZATION N/A 04/19/2015   Procedure: Left Heart Cath and Coronary Angiography;  Surgeon: Peter M Swaziland, MD;  [facility]: [facility] INVASIVE  CV LAB CUPID;  Service: Cardiovascular;  Laterality: N/A;  . CARDIAC CATHETERIZATION N/A 04/19/2015   Procedure: Intravascular Ultrasound/IVUS;  Surgeon: Peter M Swaziland, MD;  Location: [facility] INVAS[facility] LAB [facility]UP[facility];  Service: Cardiovascular;  Laterality: N/A;  . CORONARY ARTERY BYPASS GRAFT N/A 04/20/2015   Procedure: CORONARY ARTERY BYPASS GRAFTING (CABG) x 4 Using left internal mammary artery and saphenous vein.;  Surgeon: Loreli Slot, MD;  Location: [facility] OR;  Service: Open Heart Surgery;  Laterality: N/A;  . TEE WITHOUT CARDIOVERSION N/A 04/20/2015   Procedure: TRANSESOPHAGEAL ECHOCARDIOGRAM (TEE);  Surgeon: Loreli Slot, MD;  Location: [facility] OR;  Service: Open Heart Surgery;  Laterality: N/A;  . TONSILLECTOMY    . VEIN HARVEST Bilateral 04/20/2015   Procedure: BILATERAL SAPHENOUS VEIN HARVEST;  Surgeon: Loreli Slot, MD;  Location: [facility] OR;  Service: Open Heart Surgery;  Laterality: Bilateral;    Current Medications: Current Meds  Medication Sig  . amLODipine (NORVASC) 10 MG tablet Take 1 tablet (10 mg total) by mouth daily.  Marland Kitchen aspirin 81 MG EC tablet Take 81 mg by mouth daily.  . carvedilol (COREG) 6.25 MG tablet Take 1 tablet (6.25 mg total) by mouth 2 (two) times daily with a meal.  . Multiple Vitamins-Minerals (VISION VITAMINS PO) Take 2 tablets by mouth daily.  . Potassium 99 MG TABS Take 1 tablet by mouth daily.  . rosuvastatin (CRESTOR) 10 MG tablet Take 1 tablet (10 mg total) by mouth daily.  . Vitamin D, Cholecalciferol, 1000 UNITS CAPS Take 1 capsule by mouth daily.     Allergies:   Codeine   Social History   Socioeconomic History  . Marital status: Married    Spouse name: Not on file  . Number of children: Not on file  . Years of education: Not on file  . Highest education level: Not on file  Occupational History  . Not on file  Tobacco Use  . Smoking status: Never Smoker  . Smokeless tobacco: Never Used  Substance and Sexual Activity  . Alcohol use: No    Alcohol/week: 0.0 standard drinks  . Drug use: No  . Sexual activity: Not on file  Other Topics Concern  . Not on file  Social History Narrative  . Not on file   Social Determinants of Health   Financial Resource Strain:   . Difficulty of Paying Living Expenses: Not on file  Food Insecurity:   . Worried About Programme researcher, broadcasting/film/video in the Last Year: Not on file  . Ran Out of Food in the Last Year: Not on file   Transportation Needs:   . Lack of Transportation (Medical): Not on file  . Lack of Transportation (Non-Medical): Not on file  Physical Activity:   . Days of Exercise per Week: Not on file  . Minutes of Exercise per Session: Not on file  Stress:   . Feeling of Stress : Not on file  Social Connections:   . Frequency of Communication with Friends and Family: Not on file  . Frequency of Social Gatherings with Friends and Family: Not on file  . Attends Religious Services: Not on file  . Active Member of Clubs or Organizations: Not on file  . Attends Banker Meetings: Not on file  . Marital Status: Not on file     Family History: The patient's family history includes Deep vein thrombosis in an other family member; Diabetes in her mother and sister; Emphysema in her father; Heart disease in  her mother; Protein S deficiency in her brother, sister, and another family member; Stroke in her brother.  ROS:   Please see the history of present illness.    All other systems reviewed and are negative.  EKGs/Labs/Other Studies Reviewed:    The following studies were reviewed today: Summary:  Right Carotid: Velocities in the right ICA are consistent with a 60-79%         stenosis. Non-hemodynamically significant plaque <50% noted  in         the CCA. The ECA appears >50% stenosed. There was no  evidence of         thrombus, dissection, atherosclerotic plaque or stenosis in  the         cervical carotid system.   Left Carotid: Velocities in the left ICA are consistent with a 40-59%  stenosis.        Non-hemodynamically significant plaque <50% noted in the  CCA. The        ECA appears >50% stenosed. There was no evidence of  thrombus,        dissection, atherosclerotic plaque or stenosis in the  cervical        carotid system.   Vertebrals: Bilateral vertebral arteries demonstrate antegrade flow.  Subclavians:  Normal flow hemodynamics were seen in bilateral subclavian        arteries.   *See table(s) above for measurements and observations.    Vascular consult recommended.   Electronically signed by Norman Herrlich MD on 09/06/2020 at 3:45:48 PM.    Recent Labs: 08/11/2020: BUN 17; Creatinine, Ser 0.82; Potassium 3.8; Sodium 134 09/15/2020: ALT 13  Recent Lipid Panel    Component Value Date/Time   CHOL 229 (H) 09/15/2020 0905   TRIG 226 (H) 09/15/2020 0905   HDL 51 09/15/2020 0905   CHOLHDL 4.5 (H) 09/15/2020 0905   CHOLHDL 5.6 04/15/2015 0615   VLDL 17 04/15/2015 0615   LDLCALC 138 (H) 09/15/2020 0905    Physical Exam:    VS:  BP (!) 164/88   Pulse 64   Ht 5\' 4"  (1.626 m)   Wt 142 lb 12.8 oz (64.8 kg)   SpO2 98%   BMI 24.51 kg/m     Wt Readings from Last 3 Encounters:  09/20/20 142 lb 12.8 oz (64.8 kg)  08/11/20 142 lb 6.4 oz (64.6 kg)  06/21/15 128 lb (58.1 kg)     GEN: Patient is in no acute distress HEENT: Normal NECK: No JVD; No carotid bruits LYMPHATICS: No lymphadenopathy CARDIAC: Hear sounds regular, 2/6 systolic murmur at the apex. RESPIRATORY:  Clear to auscultation without rales, wheezing or rhonchi  ABDOMEN: Soft, non-tender, non-distended MUSCULOSKELETAL:  No edema; No deformity  SKIN: Warm and dry NEUROLOGIC:  Alert and oriented x 3 PSYCHIATRIC:  Normal affect   Signed, 08/22/15, MD  09/20/2020 2:19 PM     [facility]

## 2020-09-20 NOTE — Patient Instructions (Signed)
Medication Instructions:  Your physician has recommended you make the following change in your medication:   Increase your Rosuvastatin to 20 mg daily.  *If you need a refill on your cardiac medications before your next appointment, please call your pharmacy*   Lab Work: Your physician recommends that you return for lab work in: 6 weeks (10/31/20). You need to have labs done when you are fasting.  You can come Monday through Friday 8:30 am to 12:00 pm and 1:15 to 4:30. You do not need to make an appointment as the order has already been placed. The labs you are going to have done are BMET, LFT and Lipids.   If you have labs (blood work) drawn today and your tests are completely normal, you will receive your results only by:  MyChart Message (if you have MyChart) OR  A paper copy in the mail If you have any lab test that is abnormal or we need to change your treatment, we will call you to review the results.   Testing/Procedures: Non-Cardiac CT Angiography (CTA), is a special type of CT scan that uses a computer to produce multi-dimensional views of major blood vessels throughout the body. In CT angiography, a contrast material is injected through an IV to help visualize the blood vessels  This will be scheduled at [facility].   Follow-Up: At [facility], you and your health needs are our priority.  As part of our continuing mission to provide you with exceptional heart care, we have created designated Provider Care Teams.  These Care Teams include your primary Cardiologist (physician) and Advanced Practice Providers (APPs -  Physician Assistants and Nurse Practitioners) who all work together to provide you with the care you need, when you need it.  We recommend signing up for the patient portal called "MyChart".  Sign up information is provided on this After Visit Summary.  MyChart is used to connect with patients for Virtual Visits (Telemedicine).  Patients are able to view  lab/test results, encounter notes, upcoming appointments, etc.  Non-urgent messages can be sent to your provider as well.   To learn more about what you can do with MyChart, go to [email or web address].    Your next appointment:   3 month(s)  The format for your next appointment:   In Person  Provider:   Belva Crome, MD   Other Instructions NA

## 2020-09-28 ENCOUNTER — Telehealth: Payer: Self-pay

## 2020-09-28 NOTE — Telephone Encounter (Signed)
Left VM for pt to callback to give date of CT angio neck. Appt is 10/04/20 arrive at 2:00 for a 3:00 scan.

## 2020-09-28 NOTE — Telephone Encounter (Signed)
Correction for CT scan appointment is 10/05/20 arrive at 12:00 for a 1:00 appointment. Left message for pt is to callback.

## 2020-09-29 NOTE — Telephone Encounter (Signed)
Left VM to callback to in reference to call for appointment for CT at [facility].

## 2020-09-30 NOTE — Telephone Encounter (Signed)
Message left for pt to callback for appointment details. Will mail to pt as well.

## 2020-09-30 NOTE — Telephone Encounter (Signed)
Spoke with the patient and advised her of the appointment. Pt verbalized understanding and had no additional questions.

## 2020-09-30 NOTE — Telephone Encounter (Signed)
Patient returning call.

## 2020-10-05 ENCOUNTER — Encounter: Payer: Self-pay | Admitting: Cardiology

## 2020-10-13 ENCOUNTER — Telehealth: Payer: Self-pay

## 2020-10-13 NOTE — Telephone Encounter (Signed)
Results reviewed with pt as per Dr. Kem Parkinson note.  Pt verbalized understanding and had no additional questions. Pt has an appt with vascular scheduled for 10/27/20.

## 2020-10-25 ENCOUNTER — Ambulatory Visit (INDEPENDENT_AMBULATORY_CARE_PROVIDER_SITE_OTHER): Payer: Medicare PPO | Admitting: Vascular Surgery

## 2020-10-25 ENCOUNTER — Ambulatory Visit (HOSPITAL_COMMUNITY)
Admission: RE | Admit: 2020-10-25 | Discharge: 2020-10-25 | Disposition: A | Discharge status: Home / Self Care | Payer: Medicare PPO | Source: Ambulatory Visit | Attending: Vascular Surgery | Admitting: Vascular Surgery

## 2020-10-25 ENCOUNTER — Other Ambulatory Visit: Payer: Self-pay

## 2020-10-25 ENCOUNTER — Encounter: Payer: Self-pay | Admitting: Vascular Surgery

## 2020-10-25 ENCOUNTER — Other Ambulatory Visit: Payer: Self-pay | Admitting: *Deleted

## 2020-10-25 VITALS — BP 177/82 | HR 71 | Temp 97.7°F | Resp 20 | Ht 64.0 in | Wt 142.0 lb

## 2020-10-25 DIAGNOSIS — I779 Disorder of arteries and arterioles, unspecified: Secondary | ICD-10-CM | POA: Diagnosis not present

## 2020-10-25 DIAGNOSIS — I6523 Occlusion and stenosis of bilateral carotid arteries: Secondary | ICD-10-CM

## 2020-10-25 NOTE — Progress Notes (Signed)
ASSESSMENT & PLAN:  Mailyn Steichen is a 79 y.o. female with asymptomatic bilateral carotid artery stenosis. Imaging reports are discordant. Recent duplex suggests R>L stenosis not quite at threshold for intervention. CTA suggests 23% LICA stenosis.   Will repeat carotid ultrasound today in the office.  I explained risks / benefits / alternatives for carotid endarterectomy and transcarotid artery revascularization (TCAR). I counseled her that severe asymptomatic carotid artery stenosis poses a small but real risk of causing a stroke. She is not interested in any surgical therapy for carotid artery stenosis at this time.    The patient should continue best medical therapy for carotid artery stenosis including: Complete cessation from all tobacco products. Blood glucose control with goal A1c < 7%. Blood pressure control with goal blood pressure < 140/90 mmHg. Lipid reduction therapy with goal LDL-C <100 mg/dL (<70 if symptomatic from carotid artery stenosis).  Aspirin 39m PO QD.  Atorvastatin 40-80mg PO QD (or other "high intensity" statin therapy).  CHIEF COMPLAINT:   Carotid stenosis  HISTORY:  HISTORY OF PRESENT ILLNESS: CHalona Amstutzis a 79y.o. female with strong history of CAD (s/p CABGx4 and PCI with intervention), HTN, HLA who presents for evaluation of carotid artery stenosis.  She is currently asymptomatic.  She specifically denies weakness, numbness, visual change, dysphagia, dysarthria.  She has been recently compliant with aspirin and statin therapy.  She is not a smoker.  She denies symptoms of intermittent claudication, ischemic rest pain, ischemic ulceration.  Past Medical History:  Diagnosis Date  . Bilateral carotid bruits 04/14/2015  . CAD (coronary artery disease)   . Cardiac murmur 08/11/2020  . Carotid bruit 08/11/2020  . Carotid disease, bilateral ([facility] 04/2014   40-59% bilaterally  . Hyperlipidemia 04/19/2015  . Hypertension 03/2015  . Hypertensive urgency  04/14/2015  . [place]I (non[place]ST elevated [place] infarction[place]  . Peripheral arterial disease ([facility] 04/2014   ABI 0.49 on right and 0.54 left  . 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 CARDIAC CATHETERIZATION N/A 04/19/2015   Procedure: Left Heart Cath and Coronary Angiography;  Surgeon: Peter M JMartinique MD;  [facility];  Service: Cardiovascular;  Laterality: N/A;  . CARDIAC CATHETERIZATION N/A 04/19/2015   Procedure: Intravascular Ultrasound/IVUS;  Surgeon: Peter M JMartinique MD;  [facility];  Service: Cardiovascular;  Laterality: N/A;  . CORONARY ARTERY BYPASS GRAFT N/A 04/20/2015   Procedure: CORONARY ARTERY BYPASS GRAFTING (CABG) x 4 Using left internal mammary artery and saphenous vein.;  Surgeon: SMelrose Nakayama MD;  [place] [facility]  Service: Open Heart Surgery;  Laterality: N/A;  . TEE WITHOUT CARDIOVERSION N/A 04/20/2015   Procedure: TRANSESOPHAGEAL ECHOCARDIOGRAM (TEE);  Surgeon: SMelrose Nakayama MD;  [place] [facility] [place]  Service: Open Heart Surgery;  Laterality: N/A;  . TONSILLECTOMY    . VEIN HARVEST Bilateral 04/20/2015   Procedure: BILATERAL SAPHENOUS VEIN HARVEST;  Surgeon: SMelrose Nakayama MD;  [place] [facility] [place]  Service: Open Heart Surgery;  Laterality: Bilateral;    Family History  Problem Relation Age of Onset  . Heart disease Mother   . Diabetes Mother   . Emphysema Father   . Protein S deficiency Sister   . Protein S deficiency Brother   . Stroke Brother   . Diabetes Sister   . Protein S deficiency Other        "family"  . Deep vein thrombosis Other        "  family"    Social History   Socioeconomic History  . Marital status: Married    Spouse name: Not on file  . Number of children: Not on file  . Years of education: Not on file  . Highest education level: Not on file  Occupational History  . Not on file  Tobacco Use  . Smoking status: Never Smoker  .  Smokeless tobacco: Never Used  Substance and Sexual Activity  . Alcohol use: No    Alcohol/week: 0.0 standard drinks  . Drug use: No  . Sexual activity: Not on file  Other Topics Concern  . Not on file  Social History Narrative  . Not on file   Social Determinants of Health   Financial Resource Strain:   . Difficulty of Paying Living Expenses: Not on file  Food Insecurity:   . Worried About Charity fundraiser in the Last Year: Not on file  . Ran Out of Food in the Last Year: Not on file  Transportation Needs:   . Lack of Transportation (Medical): Not on file  . Lack of Transportation (Non-Medical): Not on file  Physical Activity:   . Days of Exercise per Week: Not on file  . Minutes of Exercise per Session: Not on file  Stress:   . Feeling of Stress : Not on file  Social Connections:   . Frequency of Communication with Friends and Family: Not on file  . Frequency of Social Gatherings with Friends and Family: Not on file  . Attends Religious Services: Not on file  . Active Member of Clubs or Organizations: Not on file  . Attends Archivist Meetings: Not on file  . Marital Status: Not on file  Intimate Partner Violence:   . Fear of Current or Ex-Partner: Not on file  . Emotionally Abused: Not on file  . Physically Abused: Not on file  . Sexually Abused: Not on file    Allergies  Allergen Reactions  . Codeine Other (See Comments)    Makes me crazy    Current Outpatient Medications  Medication Sig Dispense Refill  . amLODipine (NORVASC) 10 MG tablet Take 1 tablet (10 mg total) by mouth daily. 90 tablet 3  . aspirin EC 81 MG tablet Take 81 mg by mouth daily. Swallow whole.    . carvedilol (COREG) 6.25 MG tablet Take 1 tablet (6.25 mg total) by mouth 2 (two) times daily with a meal. 60 tablet 11  . Multiple Vitamins-Minerals (VISION VITAMINS PO) Take 2 tablets by mouth daily.    . Potassium 99 MG TABS Take 1 tablet by mouth daily.    . rosuvastatin (CRESTOR)  20 MG tablet Take 1 tablet (20 mg total) by mouth daily. 30 tablet 6  . Vitamin D, Cholecalciferol, 1000 UNITS CAPS Take 1 capsule by mouth daily.     No current facility-administered medications for this visit.    REVIEW OF SYSTEMS:  [X]  denotes positive finding, [ ]  denotes negative finding Cardiac  Comments:  Chest pain or chest pressure:    Shortness of breath upon exertion:    Short of breath when lying flat:    Irregular heart rhythm:        Vascular    Pain in calf, thigh, or hip brought on by ambulation: x   Pain in feet at night that wakes you up from your sleep:     Blood clot in your veins:    Leg swelling:  Pulmonary    Oxygen at home:    Productive cough:     Wheezing:         Neurologic    Sudden weakness in arms or legs:     Sudden numbness in arms or legs:     Sudden onset of difficulty speaking or slurred speech:    Temporary loss of vision in one eye:     Problems with dizziness:         Gastrointestinal    Blood in stool:     Vomited blood:         Genitourinary    Burning when urinating:     Blood in urine:        Psychiatric    Major depression:         Hematologic    Bleeding problems:    Problems with blood clotting too easily:        Skin    Rashes or ulcers:        Constitutional    Fever or chills:     PHYSICAL EXAM:   Vitals:   10/25/20 1224 10/25/20 1226  BP: (!) 147/72 (!) 177/82  Pulse: 71   Resp: 20   Temp: 97.7 F (36.5 C)   SpO2: 96%   Weight: 142 lb (64.4 kg)   Height: 5' 4"  (1.626 m)     Constitutional: Well appearing elderly woman in no distress. Appears well nourished.  Neurologic: Normal gait and station. CN intact.  No weakness.  No sensory loss. Psychiatric: Mood and affect symmetric and appropriate. Eyes: No icterus.  No conjunctival pallor. Ears, nose, throat: mucous membranes moist.  Midline trachea.  Cardiac: regular rate and rhythm.  Respiratory: Unlabored Abdominal: soft, non-tender,  non-distended.  Peripheral vascular:  Radial pulse: L 2+/ R 2+  Dorsalis pedis pulse: L absent/ R absent  Posterior tibial pulse: L absent/ R absent Extremity: No edema.  No cyanosis.  No pallor.  Skin: No gangrene.  No ulceration.  Lymphatic: No Stemmer's sign.  No palpable lymphadenopathy.  DATA REVIEW:    Most recent CBC CBC Latest Ref Rng & Units 04/24/2015 04/23/2015 04/22/2015  WBC 4.0 - 10.5 K/uL 8.6 10.6(H) 14.0(H)  Hemoglobin 12.0 - 15.0 g/dL 8.3(L) 8.3(L) 8.8(L)  Hematocrit 36 - 46 % 25.1(L) 25.0(L) 26.4(L)  Platelets 150 - 400 K/uL 287 214 252     Most recent CMP CMP Latest Ref Rng & Units 09/15/2020 08/11/2020 04/24/2015  Glucose 65 - 99 mg/dL - 100(H) 99  BUN 8 - 27 mg/dL - 17 17  Creatinine 0.57 - 1.00 mg/dL - 0.82 0.78  Sodium 134 - 144 mmol/L - 134 136  Potassium 3.5 - 5.2 mmol/L - 3.8 3.6  Chloride 96 - 106 mmol/L - 94(L) 101  CO2 20 - 29 mmol/L - 26 24  Calcium 8.7 - 10.3 mg/dL - 9.8 8.4(L)  Total Protein 6.0 - 8.5 g/dL 7.4 7.3 -  Total Bilirubin 0.0 - 1.2 mg/dL 0.4 0.3 -  Alkaline Phos 44 - 121 IU/L 99 105 -  AST 0 - 40 IU/L 20 23 -  ALT 0 - 32 IU/L 13 16 -    Renal function CrCl cannot be calculated (Patient's most recent lab result is older than the maximum 21 days allowed.).  Hgb A1c MFr Bld (%)  Date Value  04/19/2015 5.9 (H)    LDL Chol Calc (NIH)  Date Value Ref Range Status  09/15/2020 138 (H) 0 - 99 mg/dL Final  Yevonne Aline. Stanford Breed, MD [facility] Phone Number: [phone] 10/25/2020 12:19 PM

## 2020-10-31 LAB — LIPID PANEL
Chol/HDL Ratio: 3.4 ratio (ref 0.0–4.4)
Cholesterol, Total: 189 mg/dL (ref 100–199)
HDL: 56 mg/dL (ref >39)
LDL Chol Calc (NIH): 104 mg/dL — ABNORMAL HIGH (ref 0–99)
Triglycerides: 169 mg/dL — ABNORMAL HIGH (ref 0–149)
VLDL Cholesterol Cal: 29 mg/dL (ref 5–40)

## 2020-10-31 LAB — BASIC METABOLIC PANEL
BUN/Creatinine Ratio: 17 (ref 12–28)
BUN: 13 mg/dL (ref 8–27)
CO2: 25 mmol/L (ref 20–29)
Calcium: 9.5 mg/dL (ref 8.7–10.3)
Chloride: 102 mmol/L (ref 96–106)
Creatinine, Ser: 0.76 mg/dL (ref 0.57–1.00)
GFR calc Af Amer: 87 mL/min/{1.73_m2} (ref >59)
GFR calc non Af Amer: 75 mL/min/{1.73_m2} (ref >59)
Glucose: 86 mg/dL (ref 65–99)
Potassium: 4.2 mmol/L (ref 3.5–5.2)
Sodium: 143 mmol/L (ref 134–144)

## 2020-10-31 LAB — HEPATIC FUNCTION PANEL
ALT: 17 IU/L (ref 0–32)
AST: 20 IU/L (ref 0–40)
Albumin: 4.7 g/dL (ref 3.7–4.7)
Alkaline Phosphatase: 108 IU/L (ref 44–121)
Bilirubin Total: 0.3 mg/dL (ref 0.0–1.2)
Bilirubin, Direct: <0.1 mg/dL (ref 0.00–0.40)
Total Protein: 7.4 g/dL (ref 6.0–8.5)

## 2020-11-03 NOTE — Addendum Note (Signed)
Addended by: Eleonore Chiquito on: 11/03/2020 01:26 PM   Modules accepted: Orders

## 2020-12-14 LAB — HEPATIC FUNCTION PANEL
ALT: 19 IU/L (ref 0–32)
AST: 25 IU/L (ref 0–40)
Albumin: 4.5 g/dL (ref 3.7–4.7)
Alkaline Phosphatase: 101 IU/L (ref 44–121)
Bilirubin Total: 0.3 mg/dL (ref 0.0–1.2)
Bilirubin, Direct: <0.1 mg/dL (ref 0.00–0.40)
Total Protein: 7.1 g/dL (ref 6.0–8.5)

## 2020-12-14 LAB — LIPID PANEL
Chol/HDL Ratio: 3.6 ratio (ref 0.0–4.4)
Cholesterol, Total: 193 mg/dL (ref 100–199)
HDL: 54 mg/dL (ref >39)
LDL Chol Calc (NIH): 113 mg/dL — ABNORMAL HIGH (ref 0–99)
Triglycerides: 150 mg/dL — ABNORMAL HIGH (ref 0–149)
VLDL Cholesterol Cal: 26 mg/dL (ref 5–40)

## 2020-12-21 ENCOUNTER — Ambulatory Visit: Payer: Medicare PPO | Admitting: Cardiology
